# Patient Record
Sex: Male | Born: 1990 | Race: Black or African American | Hispanic: No | Marital: Single | Smoking: Never smoker
Health system: Southern US, Community
[De-identification: ages and names within clinical notes are randomized; demographics above are authoritative.]

## PROBLEM LIST (undated history)

## (undated) ENCOUNTER — Emergency Department (HOSPITAL_COMMUNITY)

## (undated) HISTORY — PX: RETINAL DETACHMENT SURGERY: SHX105

---

## 2013-05-29 ENCOUNTER — Ambulatory Visit (INDEPENDENT_AMBULATORY_CARE_PROVIDER_SITE_OTHER): Payer: 59 | Admitting: Internal Medicine

## 2013-05-29 VITALS — BP 113/64 | HR 72 | Temp 98.3°F | Resp 18 | Ht 70.5 in | Wt 147.0 lb

## 2013-05-29 DIAGNOSIS — Z0000 Encounter for general adult medical examination without abnormal findings: Secondary | ICD-10-CM

## 2013-05-29 DIAGNOSIS — Z719 Counseling, unspecified: Secondary | ICD-10-CM

## 2013-05-29 NOTE — Patient Instructions (Addendum)
Sickle Cell Disease °Sickle cell disease is also known as sickle cell anemia. This condition affects red blood cells (RBC's). Sickle cell disease is a genetically inherited disorder. RBC's are important in the body, because they contain hemoglobin. Hemoglobin carries oxygen to all of the tissues in the body. People who suffer from sickle cell disease have abnormally shaped hemoglobin molecules that look like sickles, and they cannot carry oxygen as well as normal hemoglobin molecules. The sickle cells also have a chemical on their surface that causes them to stick to vessel walls, so they have a more difficult time squeezing through small vessels. Sickle cells also have a shorter lifespan compared to normal RBC's. This means that the body's bone marrow, where RBC's are produced, must work harder to try and make RBC's faster than the die. However, for many individuals suffering from sickle cells disease, there bone marrow is not efficient enough. The resulting condition is known as anemia (a low number of RBC's). The severity of sickle cell disease depends on many factors, including, oxygen deprivation, concentration of hemoglobin, and the amount of a protective molecule called hemoglobin F (F for fetal). The people with greater amounts of hemoglobin F are better protected. °RISK FACTORS  °· Family members with sickle cell disease (both parents have to have the abnormal gene). °· Illness. °· Exertion. °· Dehydration. °· Family origins in areas with high incidence of malaria (Africa, [place], the Mediterranean, South and [place], the [place], and the Middle [place]). °· Physical stress. °· High altitude. °SYMPTOMS  °· Fever. °· Swelling of the hands and feet. °· Enlargement of the belly (heart, liver, and spleen). °· Frequent lung infections. °· Fatigue. °· Irritability. °· Yellowing of the skin (jaundice). °· Severe bone and joint pain. °· Delayed puberty. °· Shortness of breath. °· Pain in the belly, especially  in the upper right side of the abdomen. °· Nausea. °· Prolonged, sometimes painful erections (priapism). °· Rapid or labored breathing. °· Frequent infections. °PREVENTION  °· There are no proven methods for preventing sickle cell crises. °· Regularly follow up with your doctor. °· Avoid dehydration. °· Avoid high-altitude travel, especially rapid increases in altitude. °· Avoid stress. °· Get plenty of rest. °· Stay warm. °· Male patients may drink cranberry juice to help prevent urinary tract infections. °· Maintain good nutrition by eating green, red, and yellow vegetables; fruits; and juices that are rich in antioxidants and other important nutrients. °· Eat fish and soy products that are high in omega-three fatty acids. °· Make sure you consume enough folic acid, zinc, vitamin E, vitamin C, and L -glutamine. °· Blood transfusions. °· Immunizations, particularly against the flu and some bacteria, may reduce the risk of infection and thus sickle crisis. °TREATMENT °If sickle cell disease causes painful symptoms, then over-the-counter pain medications (ie. acetaminophen or ibuprofen) may be used, but consult your caregiver before use. Sickle cell disease is treated by managing pain and maintaining hydration. For patients with shortness of breath or difficulty breathing, oxygen may be given. Children who are at high-risk for the disease may be give blood transfusions. Other medications exist to help the body produce protective hemoglobin, it is important to discuss these with your caregiver. °Document Released: 12/08/2005 Document Revised: 03/01/2012 Document Reviewed: 03/22/2009 °ExitCare® Patient Information ©2014 [employer]. ° °

## 2013-05-29 NOTE — Progress Notes (Signed)
Subjective:    Patient ID: Steven Gross, male    DOB: 12/03/1991, 22 y.o.   MRN: [id number]  HPI Patient presents for a physical for school/ for a police conference in [place]. He is in school at [employer], he expects to graduate in 2015. 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 He denies any medical problems and states he is up to date with all vaccines. Patient is a non smoker, denies ETOH,  exercises about 3-4 times weekly for one hour each work out. States he has no medical concerns for the visit today. He does have some complaints of pain after bending over during the exam.   Tuberculosis Risk Questionnaire  1. Were you born outside the [place] in one of the following parts of the world: [place], [place], [place], [place] or [place]?  No  2. Have you traveled outside the [place] and lived for more than one month in one of the following parts of the world: [place], [place], [place], [place] or [place]?  No  3. Do you have a compromised immune system such as from any of the following conditions:HIV/AIDS, organ or bone marrow transplantation, diabetes, immunosuppressive medicines (e.g. Prednisone, Remicaide), leukemia, lymphoma, cancer of the head or neck, gastrectomy or jejunal bypass, end-stage renal disease (on dialysis), or silicosis?  No   4. Have you ever or do you plan on working in: a residential care center, a health care facility, a jail or prison or homeless shelter?  No  5. Have you ever: injected illegal drugs, used crack cocaine, lived in a homeless shelter  or been in jail or prison?   No  6. Have you ever been exposed to anyone with infectious tuberculosis?  No   Tuberculosis Symptom Questionnaire  Do you currently have any of the following symptoms?  1. Unexplained cough lasting more than 3 weeks? No  2. Unexplained fever lasting more than 3 weeks. No   3. Night Sweats (sweating that leaves the bedclothes  and sheets wet)   No  4. Shortness of Breath No  5. Chest Pain No  6. Unintentional weight loss  No  7. Unexplained fatigue (very tired for no reason) No    Review of Systems  Constitutional: Negative.   Eyes: Negative.   Respiratory: Negative.   Cardiovascular: Negative.   Gastrointestinal: Negative.   Endocrine: Negative.   Genitourinary: Negative.   Musculoskeletal: Negative.   Skin: Negative.   Allergic/Immunologic: Negative.   Neurological: Negative.   Hematological: Negative.   Psychiatric/Behavioral: Negative.        Objective:   Physical Exam  Constitutional: He is oriented to person, place, and time. He appears well-developed and well-nourished. No distress.  HENT:  Head: Normocephalic.  Right Ear: External ear normal.  Left Ear: External ear normal.  Nose: Nose normal.  Mouth/Throat: Oropharynx is clear and moist.  Eyes: Conjunctivae and EOM are normal. Pupils are equal, round, and reactive to light.  Neck: Normal range of motion. Neck supple. No tracheal deviation present. No thyromegaly present.  Cardiovascular: Normal rate, regular rhythm and normal heart sounds.   No murmur heard. Pulmonary/Chest: Effort normal and breath sounds normal.  Abdominal: Soft. Bowel sounds are normal. There is no tenderness. Hernia confirmed negative in the right inguinal area and confirmed negative in the left inguinal area.  Genitourinary: Testes normal and penis normal.  Musculoskeletal: Normal range of motion. He exhibits no edema and no tenderness.  Lymphadenopathy:  He has no cervical adenopathy.       Right: No inguinal adenopathy present.       Left: No inguinal adenopathy present.  Neurological: He is alert and oriented to person, place, and time. He has normal reflexes. No cranial nerve deficit. He exhibits normal muscle tone. Coordination normal.  Skin: Skin is warm and dry. No rash noted.  Psychiatric: He has a normal mood and affect. His behavior is normal.  Judgment and thought content normal.   Well developed healthy appearing 22 y.o. Male in no distress. He does have some complaints of dizziness after bending over.      Assessment & Plan:  Healthy exam

## 2014-04-06 ENCOUNTER — Ambulatory Visit: Payer: Self-pay | Admitting: Family Medicine

## 2014-04-06 VITALS — BP 120/80 | HR 77 | Temp 98.3°F | Resp 16 | Ht 69.0 in | Wt 149.0 lb

## 2014-04-06 DIAGNOSIS — Z0289 Encounter for other administrative examinations: Secondary | ICD-10-CM

## 2014-04-06 DIAGNOSIS — Z0000 Encounter for general adult medical examination without abnormal findings: Secondary | ICD-10-CM

## 2014-04-06 NOTE — Progress Notes (Signed)
[facility] [address], [place] [id number] [phone]  Date:  04/06/2014   Name:  Steven Gross   DOB:  1991/05/25   MRN:  [id number]  PCP:  France RavensBRETT,CHARLES B, MD    Chief Complaint: Employment Physical   History of Present Illness:  Steven OysterReed A Eastland is a 23 y.o. 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  Denies any significant PMHx or any complaints today.  Does not want labs or immunizations today, just his form filled out.  Reviewed forms- he has no significant history of illness or injury per his report   There are no active problems to display for this patient.   History reviewed. No pertinent past medical history.  History reviewed. No pertinent past surgical history.  History  Substance Use Topics  . Smoking status: Never Smoker   . Smokeless tobacco: Not on file  . Alcohol Use: No    History reviewed. No pertinent family history.  No Known Allergies  Medication list has been reviewed and updated.  No current outpatient prescriptions on file prior to visit.   No current facility-administered medications on file prior to visit.    Review of Systems:  As per HPI- otherwise negative.   Physical Examination: Filed Vitals:   04/06/14 1037  BP: 120/80  Pulse: 77  Temp: 98.3 F (36.8 C)  Resp: 16   Filed Vitals:   04/06/14 1037  Height: 5\' 9"  (1.753 m)  Weight: 149 lb (67.586 kg)   Body mass index is 21.99 kg/(m^2). Ideal Body Weight: Weight in (lb) to have BMI = 25: 168.9  GEN: WDWN, NAD, Non-toxic, A & O x 3, looks well HEENT: Atraumatic, Normocephalic. Neck supple. No masses, No LAD.  Bilateral TM wnl, oropharynx normal.  PEERL,EOMI.   Ears and Nose: No external deformity. CV: RRR, No M/G/R. No JVD. No thrill. No extra heart sounds. PULM: CTA B, no wheezes, crackles, rhonchi. No retractions. No resp. distress. No accessory muscle  use. ABD: S, NT, ND, +BS. No rebound. No HSM. EXTR: No c/c/e NEURO Normal gait.  PSYCH: Normally interactive. Conversant. Not depressed or anxious appearing.  Calm demeanor.  GU: no inguinal hernia Normal strength, sensation and DTR of all extremities     Assessment and Plan: Physical exam  administrative PE for police department.  My only comment is that he needs to update his corrective lenses rx.  He will plan to do so.  He thinks he has an up to date PPD from work, but if he needs one we can do for him  Signed Abbe AmsterdamJessica Copland, MD

## 2014-04-06 NOTE — Progress Notes (Signed)
U/A SpGr- 1.010 Protein- Trace Blood- Neg Glucose- Neg Tuberculosis Risk Questionnaire  1. No Were you born outside the [place] in one of the following parts of the world: [place], [place], [place], [place] or [place]?    2. No Have you traveled outside the [place] and lived for more than one month in one of the following parts of the world: [place], [place], [place], [place] or [place]?    3. No Do you have a compromised immune system such as from any of the following conditions:HIV/AIDS, organ or bone marrow transplantation, diabetes, immunosuppressive medicines (e.g. Prednisone, Remicaide), leukemia, lymphoma, cancer of the head or neck, gastrectomy or jejunal bypass, end-stage renal disease (on dialysis), or silicosis?     4. Yes Have you ever or do you plan on working in: a residential care center, a health care facility, a jail or prison or homeless shelter?    5. No Have you ever: injected illegal drugs, used crack cocaine, lived in a homeless shelter  or been in jail or prison?     6. No Have you ever been exposed to anyone with infectious tuberculosis?    Tuberculosis Symptom Questionnaire  Do you currently have any of the following symptoms?  1. No Unexplained cough lasting more than 3 weeks?   2. No Unexplained fever lasting more than 3 weeks.   3. No Night Sweats (sweating that leaves the bedclothes and sheets wet)     4. No Shortness of Breath   5. No Chest Pain   6. No Unintentional weight loss    7. No Unexplained fatigue (very tired for no reason)

## 2014-04-24 ENCOUNTER — Encounter: Payer: 59 | Admitting: Family Medicine

## 2014-06-09 ENCOUNTER — Ambulatory Visit (INDEPENDENT_AMBULATORY_CARE_PROVIDER_SITE_OTHER): Payer: 59

## 2014-06-09 ENCOUNTER — Ambulatory Visit (INDEPENDENT_AMBULATORY_CARE_PROVIDER_SITE_OTHER): Payer: 59 | Admitting: Emergency Medicine

## 2014-06-09 VITALS — BP 120/78 | HR 77 | Temp 98.0°F | Resp 20 | Ht 71.0 in | Wt 153.0 lb

## 2014-06-09 DIAGNOSIS — R1030 Lower abdominal pain, unspecified: Secondary | ICD-10-CM

## 2014-06-09 DIAGNOSIS — R109 Unspecified abdominal pain: Secondary | ICD-10-CM

## 2014-06-09 NOTE — Progress Notes (Signed)
   Subjective:    Patient ID: Steven Gross, male    DOB: 05/20/1991, 22 y.o.   MRN: [id number]  HPI 23 year old male pt presents with a groin injury. 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 At the end of the course he felt pain in his groin. He never felt a "pop."    Review of Systems     Objective:   Physical Exam abdomen soft nontender. Deep tendon reflexes of the lower extremities are 2+ and symmetrical. Motor strength is 5 out 5 almost groups of the lower extremities. Genital exam is normal without hernia. Testicles are normal  [facility] reading (PRIMARY) by  Dr.Daub normal       Assessment & Plan:  Patient given a note for one week to abstain from physical assessment and physical fitness training.

## 2015-02-24 ENCOUNTER — Ambulatory Visit (INDEPENDENT_AMBULATORY_CARE_PROVIDER_SITE_OTHER): Payer: 59 | Admitting: Internal Medicine

## 2015-02-24 VITALS — BP 124/68 | HR 70 | Temp 98.0°F | Resp 18 | Ht 69.5 in | Wt 151.0 lb

## 2015-02-24 DIAGNOSIS — Z0000 Encounter for general adult medical examination without abnormal findings: Secondary | ICD-10-CM

## 2015-02-24 NOTE — Progress Notes (Signed)
   Subjective:    Patient ID: Steven Gross, male    DOB: [birth year], 24 y.o.   MRN: [id number] This chart was scribed for Tonye Pearsonobert P Juanell Saffo, MD by [place] Peace, ED Scribe. The patient was seen in [facility]. The patient's care was started at 8:48 AM.  HPI  HPI Comments: Steven Gross is a 24 y.o. male [facility]. He states that he has already passed [facility] physical activity [facility] but explains that he has another physical activity test coming up next week for the [employer].    Pt denies any history of surgery, hypertension, or asthma.  Pt is non-smoker.  Immunizations up to date.  Daily exercise/good diet  There are no active problems to display for this patient.  no medications  Review of Systems  All other systems reviewed and are negative. A complete 14 system review of systems was obtained per form and all systems are negative except as noted in the HPI and PMH.      Objective:   Physical Exam  Constitutional: He is oriented to person, place, and time. He appears well-developed and well-nourished.  HENT:  Head: Normocephalic and atraumatic.  Right Ear: Hearing, tympanic membrane, external ear and ear canal normal.  Left Ear: Hearing, tympanic membrane, external ear and ear canal normal.  Nose: Nose normal.  Mouth/Throat: Uvula is midline, oropharynx is clear and moist and mucous membranes are normal.  Eyes: Conjunctivae, EOM and lids are normal. Pupils are equal, round, and reactive to light. Right eye exhibits no discharge. Left eye exhibits no discharge. No scleral icterus.  Neck: Trachea normal and normal range of motion. Neck supple. Carotid bruit is not present.  Cardiovascular: Normal rate, regular rhythm, normal heart sounds, intact distal pulses and normal pulses.   No murmur heard. Pulmonary/Chest: Effort normal and breath sounds normal.  No respiratory distress. He has no wheezes. He has no rhonchi. He has no rales.  Abdominal: Soft. Normal appearance and bowel sounds are normal. He exhibits no abdominal bruit. There is no tenderness.  Musculoskeletal: Normal range of motion. He exhibits no edema or tenderness.  Lymphadenopathy:       Head (right side): No submental, no submandibular, no tonsillar, no preauricular, no posterior auricular and no occipital adenopathy present.       Head (left side): No submental, no submandibular, no tonsillar, no preauricular, no posterior auricular and no occipital adenopathy present.    He has no cervical adenopathy.  Neurological: He is alert and oriented to person, place, and time. He has normal strength and normal reflexes. No cranial nerve deficit or sensory deficit. Coordination and gait normal.  Skin: Skin is warm, dry and intact. No lesion and no rash noted.  Psychiatric: He has a normal mood and affect. His speech is normal and behavior is normal. Judgment and thought content normal.  BP 124/68 mmHg  Pulse 70  Temp(Src) 98 F (36.7 C) (Oral)  Resp 18  Ht 5' 9.5" (1.765 m)  Wt 151 lb (68.493 kg)  BMI 21.99 kg/m2  SpO2 98%         Assessment & Plan:  I have completed the patient encounter in its entirety as documented by the scribe, with editing by me where necessary. Haniah Penny P. Merla Richesoolittle, M.D.  Routine general medical examination at a health care facility  Very healthy Discussed tightness in hamstrings

## 2015-03-02 ENCOUNTER — Telehealth: Payer: Self-pay

## 2015-03-02 NOTE — Telephone Encounter (Signed)
Patient would like for us to call his work Engineer, civil (consulting)nurse and let you know that the form we filled out and faxed to his employer was suppose to have his name on it.  We faxed this form and left his information off the form.  Please call patient if you have any questions at [phone].    Nurse Work Phone#: [phone]

## 2015-03-02 NOTE — Telephone Encounter (Signed)
Left message for pt to call back  °

## 2015-03-02 NOTE — Telephone Encounter (Signed)
Spoke with pt, he states everything was taken care of.

## 2017-04-20 ENCOUNTER — Encounter (HOSPITAL_BASED_OUTPATIENT_CLINIC_OR_DEPARTMENT_OTHER): Payer: Self-pay

## 2017-04-20 ENCOUNTER — Emergency Department (HOSPITAL_BASED_OUTPATIENT_CLINIC_OR_DEPARTMENT_OTHER): Payer: Commercial Managed Care - HMO

## 2017-04-20 ENCOUNTER — Emergency Department (HOSPITAL_BASED_OUTPATIENT_CLINIC_OR_DEPARTMENT_OTHER)
Admission: EM | Admit: 2017-04-20 | Discharge: 2017-04-21 | Disposition: A | Discharge status: Home / Self Care | Payer: Commercial Managed Care - HMO | Attending: Emergency Medicine | Admitting: Emergency Medicine

## 2017-04-20 DIAGNOSIS — R109 Unspecified abdominal pain: Secondary | ICD-10-CM | POA: Insufficient documentation

## 2017-04-20 DIAGNOSIS — R110 Nausea: Secondary | ICD-10-CM | POA: Insufficient documentation

## 2017-04-20 LAB — COMPREHENSIVE METABOLIC PANEL
ALK PHOS: 54 U/L (ref 38–126)
ALT: 16 U/L — AB (ref 17–63)
AST: 21 U/L (ref 15–41)
Albumin: 4.6 g/dL (ref 3.5–5.0)
Anion gap: 9 (ref 5–15)
BILIRUBIN TOTAL: 1.4 mg/dL — AB (ref 0.3–1.2)
BUN: 17 mg/dL (ref 6–20)
CALCIUM: 9.4 mg/dL (ref 8.9–10.3)
CO2: 27 mmol/L (ref 22–32)
Chloride: 102 mmol/L (ref 101–111)
Creatinine, Ser: 1.01 mg/dL (ref 0.61–1.24)
Glucose, Bld: 109 mg/dL — ABNORMAL HIGH (ref 65–99)
Potassium: 3.5 mmol/L (ref 3.5–5.1)
Sodium: 138 mmol/L (ref 135–145)
TOTAL PROTEIN: 8.5 g/dL — AB (ref 6.5–8.1)

## 2017-04-20 LAB — CBC WITH DIFFERENTIAL/PLATELET
BASOS ABS: 0 10*3/uL (ref 0.0–0.1)
Basophils Relative: 0 %
EOS PCT: 0 %
Eosinophils Absolute: 0 10*3/uL (ref 0.0–0.7)
HCT: 47.2 % (ref 39.0–52.0)
Hemoglobin: 17.4 g/dL — ABNORMAL HIGH (ref 13.0–17.0)
LYMPHS ABS: 0.5 10*3/uL — AB (ref 0.7–4.0)
LYMPHS PCT: 8 %
MCH: 31.7 pg (ref 26.0–34.0)
MCHC: 36.9 g/dL — ABNORMAL HIGH (ref 30.0–36.0)
MCV: 86 fL (ref 78.0–100.0)
Monocytes Absolute: 0.5 10*3/uL (ref 0.1–1.0)
Monocytes Relative: 9 %
Neutro Abs: 5 10*3/uL (ref 1.7–7.7)
Neutrophils Relative %: 83 %
PLATELETS: 309 10*3/uL (ref 150–400)
RBC: 5.49 MIL/uL (ref 4.22–5.81)
RDW: 11.4 % — AB (ref 11.5–15.5)
WBC: 6 10*3/uL (ref 4.0–10.5)

## 2017-04-20 LAB — URINALYSIS, ROUTINE W REFLEX MICROSCOPIC
Bilirubin Urine: NEGATIVE
Glucose, UA: NEGATIVE mg/dL
Hgb urine dipstick: NEGATIVE
KETONES UR: 15 mg/dL — AB
Leukocytes, UA: NEGATIVE
NITRITE: NEGATIVE
PROTEIN: NEGATIVE mg/dL
Specific Gravity, Urine: 1.037 — ABNORMAL HIGH (ref 1.005–1.030)
pH: 6.5 (ref 5.0–8.0)

## 2017-04-20 MED ORDER — FENTANYL CITRATE (PF) 100 MCG/2ML IJ SOLN
50.0000 ug | Freq: Once | INTRAMUSCULAR | Status: AC
  Administered 2017-04-20: 50 ug via INTRAVENOUS
  Filled 2017-04-20: qty 2

## 2017-04-20 MED ORDER — ONDANSETRON HCL 4 MG/2ML IJ SOLN
4.0000 mg | Freq: Once | INTRAMUSCULAR | Status: AC
Start: 2017-04-21 — End: 2017-04-20
  Administered 2017-04-20: 4 mg via INTRAVENOUS
  Filled 2017-04-20: qty 2

## 2017-04-20 NOTE — ED Triage Notes (Signed)
c/o abd pain x today-seen at urgent care-was advised to go to ED if pain worsened-pain worse since 4pm-NAD-steady gait

## 2017-04-20 NOTE — ED Provider Notes (Signed)
[facility]   CSN: [id number] Arrival date & time: 04/20/17  2235   By signing my name below, I, Clovis Pu, attest that this documentation has been prepared under the direction and in the presence of Angeleen Horney, MD  Electronically Signed: Clovis Pu, ED Scribe. 04/20/17. 11:25 PM.   History   Chief Complaint Chief Complaint  Patient presents with  . Abdominal Pain    HPI Comments:  Steven Gross is a 26 y.o. male who presents to the Emergency Department complaining of acute onset, moderate, non-radiating abdominal pain beginning around 4 PM today. Pt states his pain began while he was siting at work. Nothing makes his pain better or worse. Pt also reports nausea. His last bowel movement was at 3 PM today. No alleviating or aggravating factors noted. Pt denies vomiting, diarrhea, constipation, fevers, dysuria, frequency, hematuria, hematochezia or any other associated symptoms. Pt visited Urgent Care earlier today for similar symptoms and was advised to visit the ED for further testing. No other complaints noted at this time.   The history is provided by the patient. No language interpreter was used.  Abdominal Pain   This is a new problem. The current episode started 12 to 24 hours ago. The problem occurs constantly. The problem has not changed since onset.The pain is associated with an unknown factor. Pain location: below the umbilicus. Quality: unable to qualify. The pain is moderate. Associated symptoms include nausea. Pertinent negatives include anorexia, fever, diarrhea, flatus, vomiting, constipation, dysuria, frequency, hematuria and myalgias. Nothing aggravates the symptoms. Nothing relieves the symptoms. Past workup does not include GI consult. His past medical history does not include PUD.    History reviewed. No pertinent past medical history.  There are no active problems to display for this patient.   History reviewed. No pertinent  surgical history.   Home Medications    Prior to Admission medications   Not on File    Family History No family history on file.  Social History Social History  Substance Use Topics  . Smoking status: Never Smoker  . Smokeless tobacco: Never Used  . Alcohol use No     Allergies   Patient has no known allergies.   Review of Systems Review of Systems  Constitutional: Negative for fever.  Gastrointestinal: Positive for abdominal pain and nausea. Negative for anorexia, blood in stool, constipation, diarrhea, flatus and vomiting.  Genitourinary: Negative for dysuria, frequency and hematuria.  Musculoskeletal: Negative for myalgias.  All other systems reviewed and are negative.   Physical Exam Updated Vital Signs BP 133/74 (BP Location: Left Arm)   Pulse (!) 108   Temp 98.7 F (37.1 C) (Oral)   Resp 18   Ht  (1.778 m)   Wt 148 lb (67.1 kg)   SpO2 100%   BMI 21.24 kg/m   Physical Exam  Constitutional: He is oriented to person, place, and time. He appears well-developed and well-nourished. No distress.  HENT:  Head: Normocephalic and atraumatic.  Mouth/Throat: Oropharynx is clear and moist. No oropharyngeal exudate.  Moist mucous membranes   Eyes: Conjunctivae are normal. Pupils are equal, round, and reactive to light.  Neck: Normal range of motion. Neck supple. No JVD present.  Trachea midline No bruit  Cardiovascular: Normal rate, regular rhythm, normal heart sounds and intact distal pulses.   Pulses:      Posterior tibial pulses are 2+ on the right side, and 2+ on the left side.  Pulmonary/Chest: Effort normal and  breath sounds normal. No stridor. No respiratory distress. He has no wheezes. He has no rales.  Abdominal: Soft. Bowel sounds are normal. He exhibits no distension and no mass. There is no tenderness. There is no rebound and no guarding.  Musculoskeletal: Normal range of motion.  Compartments soft   Neurological: He is alert and oriented to  person, place, and time. He has normal reflexes.  Intact DTRs.   Skin: Skin is warm and dry. Capillary refill takes less than 2 seconds.  Psychiatric: He has a normal mood and affect. His behavior is normal.  Nursing note and vitals reviewed.    ED Treatments / Results  DIAGNOSTIC STUDIES:  Oxygen Saturation is 100% on RA, normal by my interpretation.    COORDINATION OF CARE:  11:22 PM Discussed treatment plan with pt at bedside and pt agreed to plan.  Labs (all labs ordered are listed, but only abnormal results are displayed)  Results for orders placed or performed during the hospital encounter of 04/20/17  Comprehensive metabolic panel  Result Value Ref Range   Sodium 138 135 - 145 mmol/L   Potassium 3.5 3.5 - 5.1 mmol/L   Chloride 102 101 - 111 mmol/L   CO2 27 22 - 32 mmol/L   Glucose, Bld 109 (H) 65 - 99 mg/dL   BUN 17 6 - 20 mg/dL   Creatinine, Ser 1.61 0.61 - 1.24 mg/dL   Calcium 9.4 8.9 - 09.6 mg/dL   Total Protein 8.5 (H) 6.5 - 8.1 g/dL   Albumin 4.6 3.5 - 5.0 g/dL   AST 21 15 - 41 U/L   ALT 16 (L) 17 - 63 U/L   Alkaline Phosphatase 54 38 - 126 U/L   Total Bilirubin 1.4 (H) 0.3 - 1.2 mg/dL   GFR calc non Af Amer >60 >60 mL/min   GFR calc Af Amer >60 >60 mL/min   Anion gap 9 5 - 15  Urinalysis, Routine w reflex microscopic  Result Value Ref Range   Color, Urine YELLOW YELLOW   APPearance CLEAR CLEAR   Specific Gravity, Urine 1.037 (H) 1.005 - 1.030   pH 6.5 5.0 - 8.0   Glucose, UA NEGATIVE NEGATIVE mg/dL   Hgb urine dipstick NEGATIVE NEGATIVE   Bilirubin Urine NEGATIVE NEGATIVE   Ketones, ur 15 (A) NEGATIVE mg/dL   Protein, ur NEGATIVE NEGATIVE mg/dL   Nitrite NEGATIVE NEGATIVE   Leukocytes, UA NEGATIVE NEGATIVE  CBC with Differential  Result Value Ref Range   WBC 6.0 4.0 - 10.5 K/uL   RBC 5.49 4.22 - 5.81 MIL/uL   Hemoglobin 17.4 (H) 13.0 - 17.0 g/dL   HCT 04.5 40.9 - 81.1 %   MCV 86.0 78.0 - 100.0 fL   MCH 31.7 26.0 - 34.0 pg   MCHC 36.9 (H) 30.0  - 36.0 g/dL   RDW 91.4 (L) 78.2 - 95.6 %   Platelets 309 150 - 400 K/uL   Neutrophils Relative % 83 %   Neutro Abs 5.0 1.7 - 7.7 K/uL   Lymphocytes Relative 8 %   Lymphs Abs 0.5 (L) 0.7 - 4.0 K/uL   Monocytes Relative 9 %   Monocytes Absolute 0.5 0.1 - 1.0 K/uL   Eosinophils Relative 0 %   Eosinophils Absolute 0.0 0.0 - 0.7 K/uL   Basophils Relative 0 %   Basophils Absolute 0.0 0.0 - 0.1 K/uL   Ct Abdomen Pelvis W Contrast  Result Date: 04/21/2017 CLINICAL DATA:  Periumbilical pain for 10 hours EXAM: CT ABDOMEN AND  PELVIS WITH CONTRAST TECHNIQUE: Multidetector CT imaging of the abdomen and pelvis was performed using the standard protocol following bolus administration of intravenous contrast. CONTRAST:  ISOVUE-300 IOPAMIDOL (ISOVUE-300) INJECTION 61% COMPARISON:  None. FINDINGS: Lower chest: No acute abnormality. Hepatobiliary: No focal liver abnormality is seen. No gallstones, gallbladder wall thickening, or biliary dilatation. Pancreas: Unremarkable. No pancreatic ductal dilatation or surrounding inflammatory changes. Spleen: Normal in size without focal abnormality. Adrenals/Urinary Tract: Adrenal glands are unremarkable. Kidneys are normal, without renal calculi, focal lesion, or hydronephrosis. Bladder is unremarkable. Stomach/Bowel: Stomach is within normal limits. Appendix appears normal. No evidence of bowel wall thickening, distention, or inflammatory changes. Vascular/Lymphatic: No significant vascular findings are present. No enlarged abdominal or pelvic lymph nodes. Reproductive: Prostate is unremarkable. Other: No abdominal wall hernia or abnormality. No abdominopelvic ascites. Musculoskeletal: No acute or significant osseous findings. IMPRESSION: No CT evidence for acute intra-abdominal or pelvic pathology. Normal appendix. Electronically Signed   By: Jasmine Pang M.D.   On: 04/21/2017 03:00    Procedures Procedures (including critical care time)  Medications Ordered in  ED  Medications  fentaNYL (SUBLIMAZE) injection 50 mcg (50 mcg Intravenous Given 04/20/17 2355)  ondansetron (ZOFRAN) injection 4 mg (4 mg Intravenous Given 04/20/17 2353)  iopamidol (ISOVUE-300) 61 % injection 100 mL (100 mLs Intravenous Contrast Given 04/21/17 0148)     Final Clinical Impressions(s) / ED Diagnoses  Abdominal pain:  Likely cramping, may be viral in nature.  Exam, labs and imaging are benign and reassuring.  Follow up with your pediatrician for recheck in 48 hours.  Strict return precautions given.  Bland diet x 48 hours.    I personally performed the services described in this documentation, which was scribed in my presence. The recorded information has been reviewed and is accurate.      Cy Blamer, MD 04/21/17 [phone]

## 2017-04-21 ENCOUNTER — Encounter (HOSPITAL_BASED_OUTPATIENT_CLINIC_OR_DEPARTMENT_OTHER): Payer: Self-pay | Admitting: Emergency Medicine

## 2017-04-21 MED ORDER — IOPAMIDOL (ISOVUE-300) INJECTION 61%
100.0000 mL | Freq: Once | INTRAVENOUS | Status: AC | PRN
  Administered 2017-04-21: 100 mL via INTRAVENOUS

## 2017-04-21 NOTE — ED Notes (Signed)
Patient transported to CT 

## 2017-09-10 IMAGING — CT CT ABD-PELV W/ CM
2 of 4 series · 16 of 46 positions shown, 18 images · IV contrast (APPLIED)
Comparison: None.

CLINICAL DATA: Periumbilical pain for 10 hours

EXAM:
CT ABDOMEN AND PELVIS WITH CONTRAST
TECHNIQUE: Multidetector CT imaging of the abdomen and pelvis was performed
using the standard protocol following bolus administration of
intravenous contrast.
CONTRAST:  100mL ZLLTQD-R88 IOPAMIDOL (ZLLTQD-R88) INJECTION 61%

[Series 2: axial st · axial · 0.79mm/px · z∈[-446,+10]mm · 13 of 101 slices shown, 15 images]
[im 5/101  soft-tissue]
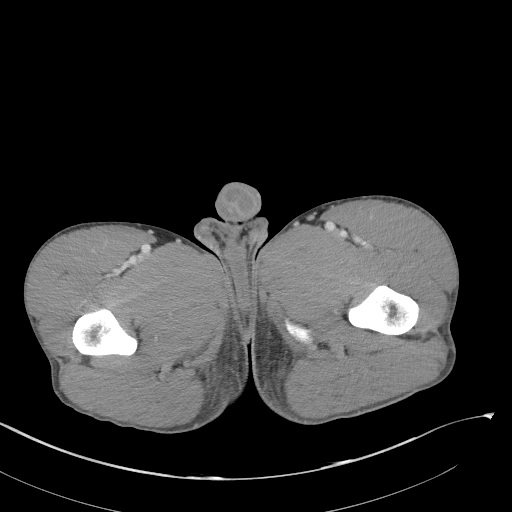
[im 5/101  bone]
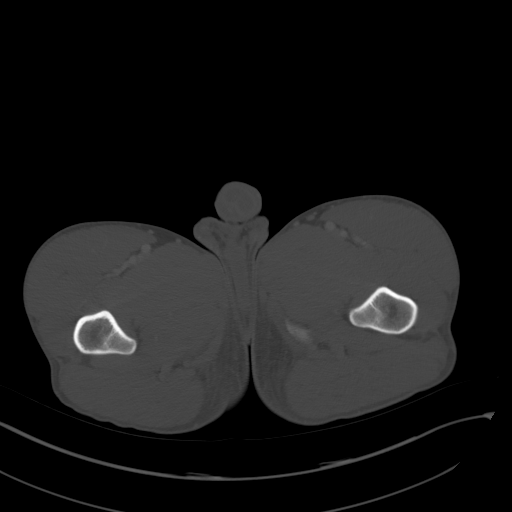
[im 13/101  soft-tissue]
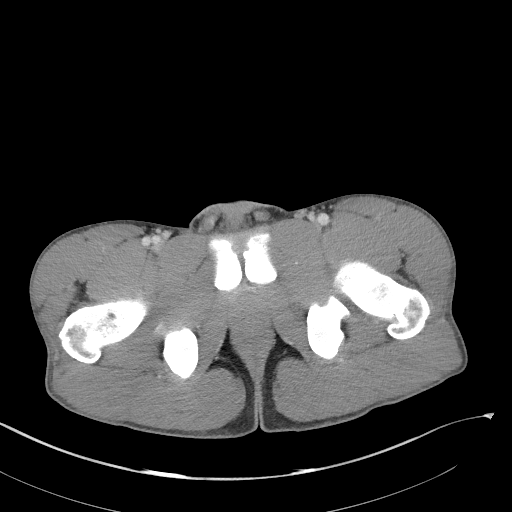
[im 21/101  soft-tissue]
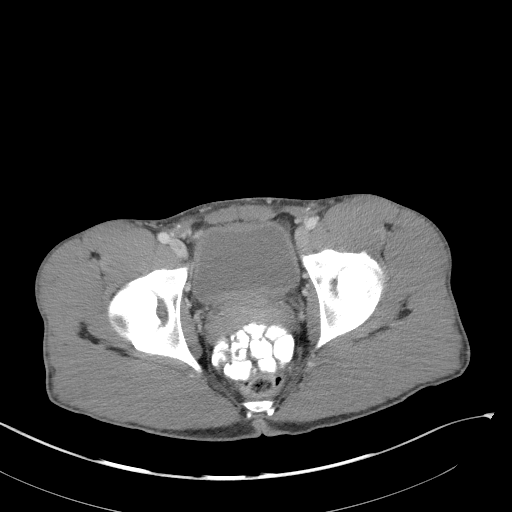
[im 30/101  soft-tissue]
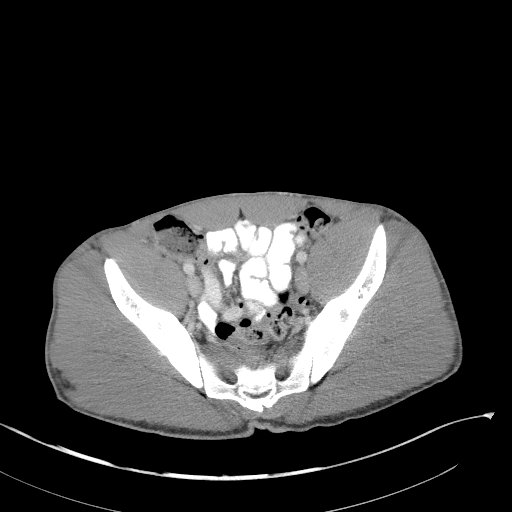
[im 34/101  soft-tissue]
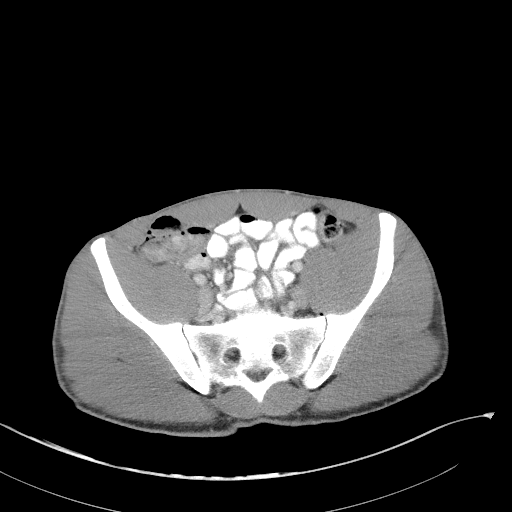
[im 42/101  soft-tissue]
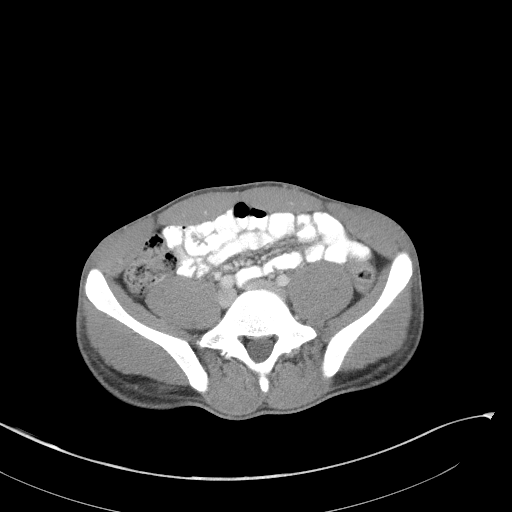
[im 51/101  soft-tissue]
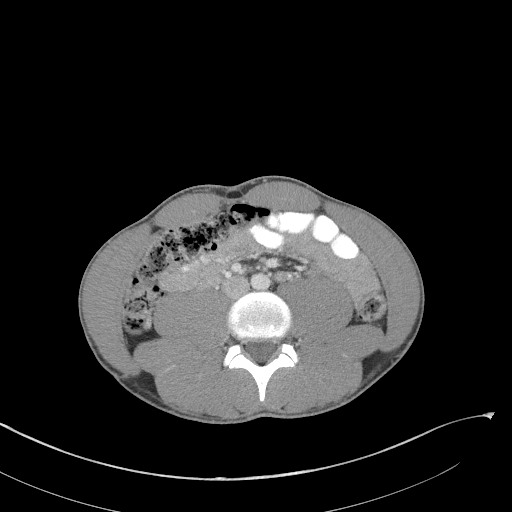
[im 59/101  soft-tissue]
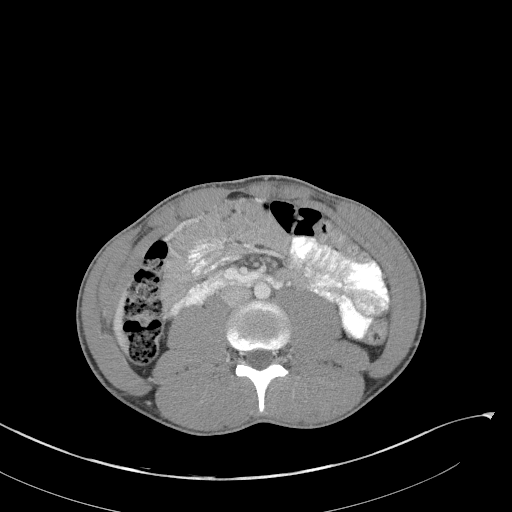
[im 67/101  soft-tissue]
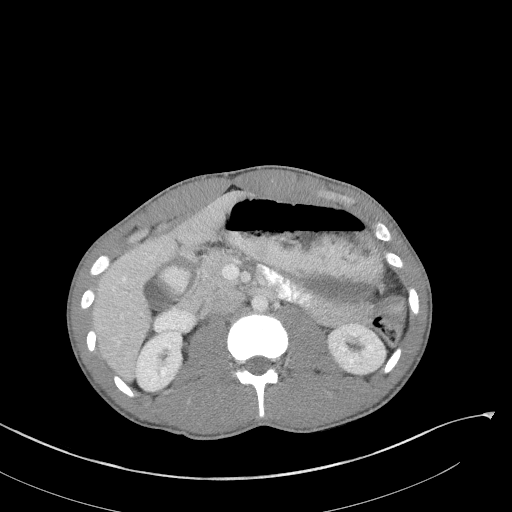
[im 67/101  bone]
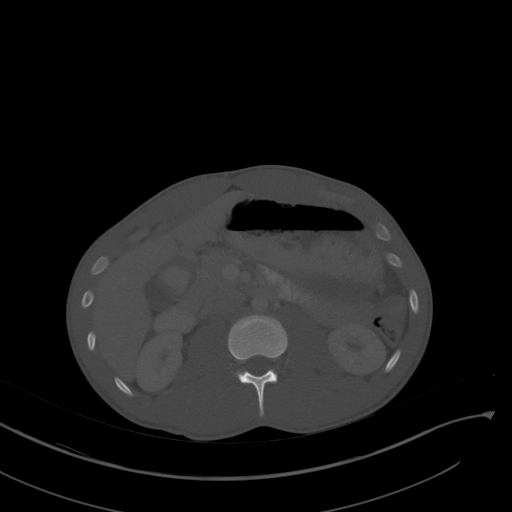
[im 71/101  soft-tissue]
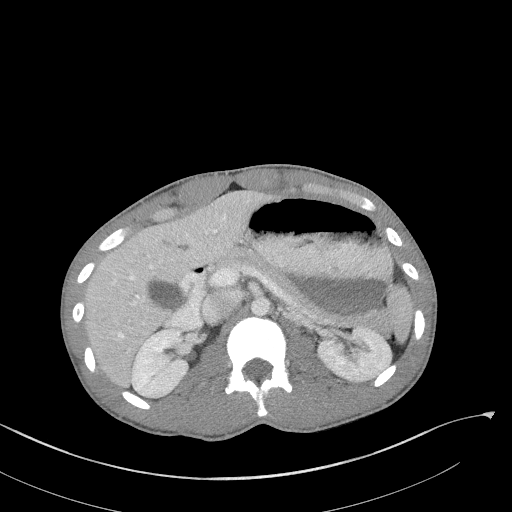
[im 80/101  soft-tissue]
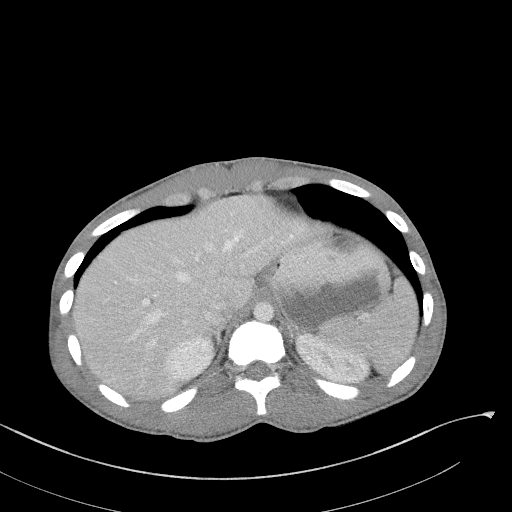
[im 88/101  soft-tissue]
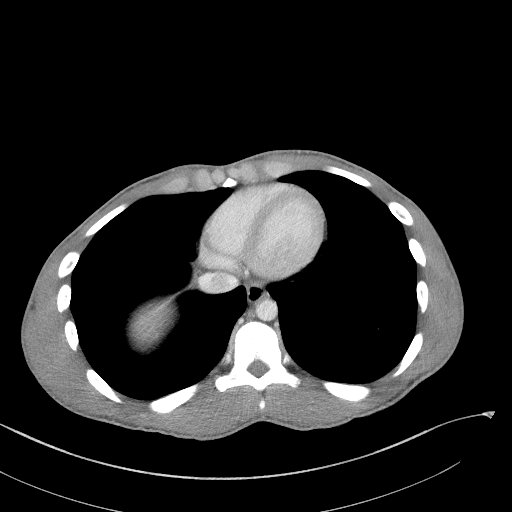
[im 96/101  soft-tissue]
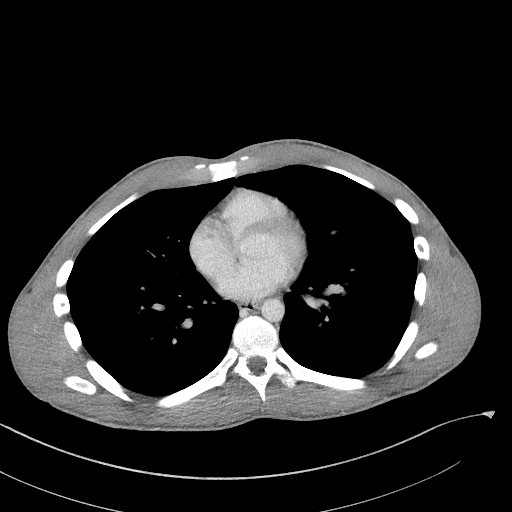

[Series 5: coronal st · coronal · 0.71mm/px · 3 of 72 slices shown]
[im 24/72  soft-tissue]
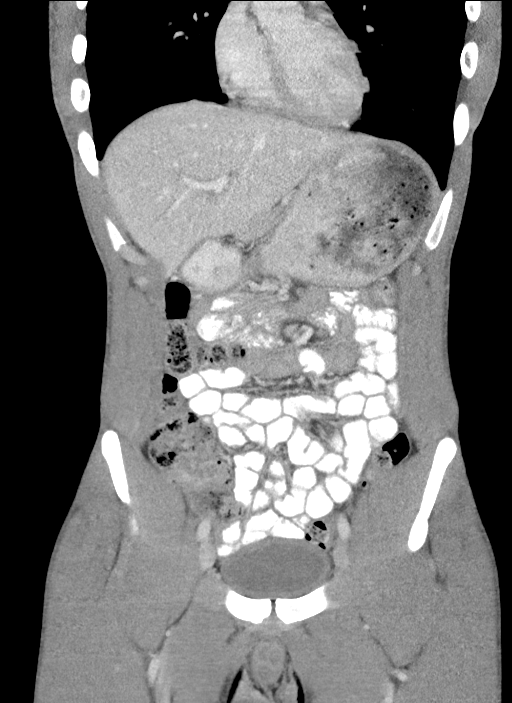
[im 32/72  soft-tissue]
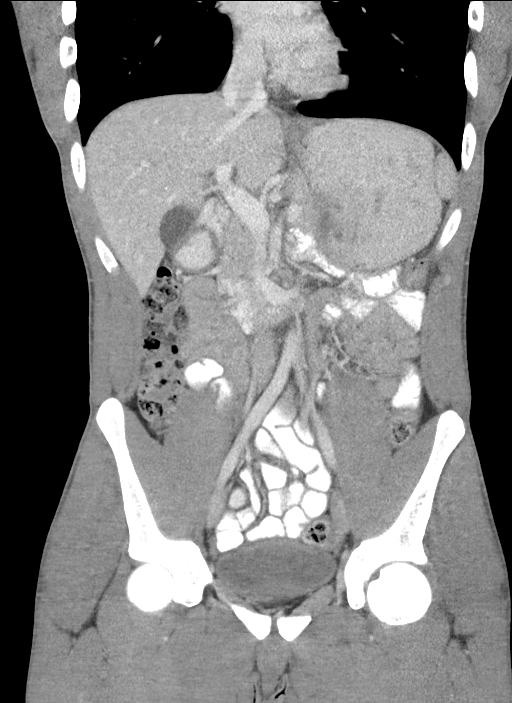
[im 40/72  soft-tissue]
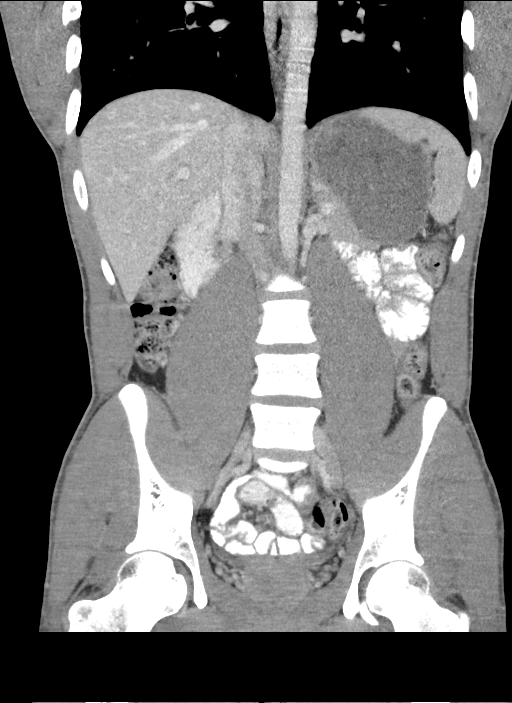

[16 of 46 positions shown; findings below may reference images not displayed]

FINDINGS: Lower chest: No acute abnormality.

Hepatobiliary: No focal liver abnormality is seen. No gallstones,
gallbladder wall thickening, or biliary dilatation.

Pancreas: Unremarkable. No pancreatic ductal dilatation or
surrounding inflammatory changes.

Spleen: Normal in size without focal abnormality.

Adrenals/Urinary Tract: Adrenal glands are unremarkable. Kidneys are
normal, without renal calculi, focal lesion, or hydronephrosis.
Bladder is unremarkable.

Stomach/Bowel: Stomach is within normal limits. Appendix appears
normal. No evidence of bowel wall thickening, distention, or
inflammatory changes.

Vascular/Lymphatic: No significant vascular findings are present. No
enlarged abdominal or pelvic lymph nodes.

Reproductive: Prostate is unremarkable.

Other: No abdominal wall hernia or abnormality. No abdominopelvic
ascites.

Musculoskeletal: No acute or significant osseous findings.
IMPRESSION: No CT evidence for acute intra-abdominal or pelvic pathology. Normal
appendix.

## 2019-02-07 ENCOUNTER — Encounter (HOSPITAL_COMMUNITY): Payer: Self-pay

## 2019-02-07 ENCOUNTER — Ambulatory Visit (HOSPITAL_COMMUNITY)
Admission: EM | Admit: 2019-02-07 | Discharge: 2019-02-07 | Disposition: A | Discharge status: Home / Self Care | Payer: Managed Care, Other (non HMO) | Attending: Family Medicine | Admitting: Family Medicine

## 2019-02-07 DIAGNOSIS — Z113 Encounter for screening for infections with a predominantly sexual mode of transmission: Secondary | ICD-10-CM | POA: Insufficient documentation

## 2019-02-07 NOTE — Discharge Instructions (Addendum)
We are testing you for STDs Your labs are pending.  We will call with any positive results.  You can check your results on MyChart for updates.

## 2019-02-07 NOTE — ED Triage Notes (Signed)
Pt presents to have HIV testing done.

## 2019-02-07 NOTE — ED Provider Notes (Signed)
[facility]    CSN: [id number] Arrival date & time: 02/07/19  0254     History   Chief Complaint Chief Complaint  Patient presents with  . STD Testing    HPI Steven Gross is a 28 y.o. male.   Patient is a 28 year old male here requesting STD testing.  He denies any current symptoms.  He is otherwise feeling fine.  He is currently sexually active with one partner, protected.  This is routine testing.     History reviewed. No pertinent past medical history.  There are no active problems to display for this patient.   History reviewed. No pertinent surgical history.     Home Medications    Prior to Admission medications   Not on File    Family History Family History  Problem Relation Age of Onset  . Healthy Mother   . Healthy Father     Social History Social History   Tobacco Use  . Smoking status: Never Smoker  . Smokeless tobacco: Never Used  Substance Use Topics  . Alcohol use: No  . Drug use: No     Allergies   Patient has no known allergies.   Review of Systems Review of Systems  Genitourinary: Negative for difficulty urinating, discharge, dysuria, enuresis, flank pain, frequency, genital sores, hematuria, penile pain, penile swelling, scrotal swelling, testicular pain and urgency.     Physical Exam Triage Vital Signs ED Triage Vitals  Enc Vitals Group     BP 02/07/19 0830 109/71     Pulse Rate 02/07/19 0830 79     Resp 02/07/19 0830 16     Temp 02/07/19 0830 98.1 F (36.7 C)     Temp Source 02/07/19 0830 Oral     SpO2 02/07/19 0830 100 %     Weight --      Height --      Head Circumference --      Peak Flow --      Pain Score 02/07/19 0903 0     Pain Loc --      Pain Edu? --      Excl. in GC? --    No data found.  Updated Vital Signs BP 109/71 (BP Location: Right Arm)   Pulse 79   Temp 98.1 F (36.7 C) (Oral)   Resp 16   SpO2 100%   Visual Acuity Right Eye Distance:   Left Eye Distance:   Bilateral  Distance:    Right Eye Near:   Left Eye Near:    Bilateral Near:     Physical Exam Vitals signs and nursing note reviewed.  Constitutional:      Appearance: He is well-developed.  HENT:     Head: Normocephalic and atraumatic.  Eyes:     Conjunctiva/sclera: Conjunctivae normal.  Neck:     Musculoskeletal: Neck supple.  Pulmonary:     Effort: Pulmonary effort is normal.  Abdominal:     Palpations: Abdomen is soft.     Tenderness: There is no abdominal tenderness.  Skin:    General: Skin is warm and dry.  Neurological:     Mental Status: He is alert.      UC Treatments / Results  Labs (all labs ordered are listed, but only abnormal results are displayed) Labs Reviewed  HIV ANTIBODY (ROUTINE TESTING W REFLEX)  RPR  URINE CYTOLOGY ANCILLARY ONLY    EKG None  Radiology No results found.  Procedures Procedures (including critical care time)  Medications  Ordered in UC Medications - No data to display  Initial Impression / Assessment and Plan / UC Course  I have reviewed the triage vital signs and the nursing notes.  Pertinent labs & imaging results that were available during my care of the patient were reviewed by me and considered in my medical decision making (see chart for details).     Routine STD testing performed Labs pending Final Clinical Impressions(s) / UC Diagnoses   Final diagnoses:  Screening examination for STD (sexually transmitted disease)     Discharge Instructions     We are testing you for STDs Your labs are pending.  We will call with any positive results.  You can check your results on MyChart for updates.    ED Prescriptions    None     Controlled Substance Prescriptions [place] Controlled Substance Registry consulted? Not Applicable   Janace Aris, NP 02/07/19 [phone]

## 2019-02-08 LAB — URINE CYTOLOGY ANCILLARY ONLY
CHLAMYDIA, DNA PROBE: NEGATIVE
NEISSERIA GONORRHEA: NEGATIVE
Trichomonas: NEGATIVE

## 2019-02-08 LAB — RPR: RPR: NONREACTIVE

## 2019-02-08 LAB — HIV ANTIBODY (ROUTINE TESTING W REFLEX): HIV SCREEN 4TH GENERATION: NONREACTIVE

## 2020-03-22 ENCOUNTER — Ambulatory Visit: Payer: Managed Care, Other (non HMO) | Attending: Family

## 2020-03-22 DIAGNOSIS — Z23 Encounter for immunization: Secondary | ICD-10-CM

## 2020-03-22 NOTE — Progress Notes (Signed)
   Covid-19 Vaccination Clinic  Name:  Steven Gross    MRN: [id number] DOB: 06-11-1991  03/22/2020  Mr. Steven Gross was observed post Covid-19 immunization for 15 minutes without incident. He was provided with Vaccine Information Sheet and instruction to access the V-Safe system.   Mr. Steven Gross was instructed to call 911 with any severe reactions post vaccine: Marland Kitchen Difficulty breathing  . Swelling of face and throat  . A fast heartbeat  . A bad rash all over body  . Dizziness and weakness   Immunizations Administered    Name Date Dose VIS Date Route   Moderna COVID-19 Vaccine 03/22/2020  3:21 PM 0.5 mL 11/22/2019 Intramuscular   Manufacturer: Moderna   Lot: [id number]   NDC: 02725-366-44

## 2020-04-24 ENCOUNTER — Ambulatory Visit: Payer: Managed Care, Other (non HMO) | Attending: Family

## 2020-04-24 DIAGNOSIS — Z23 Encounter for immunization: Secondary | ICD-10-CM

## 2020-04-24 NOTE — Progress Notes (Signed)
   Covid-19 Vaccination Clinic  Name:  Steven Gross    MRN: [id number] DOB: [birth year]  04/24/2020  Mr. Steven Gross was observed post Covid-19 immunization for 15 minutes without incident. He was provided with Vaccine Information Sheet and instruction to access the V-Safe system.   Mr. Steven Gross was instructed to call 911 with any severe reactions post vaccine: Marland Kitchen Difficulty breathing  . Swelling of face and throat  . A fast heartbeat  . A bad rash all over body  . Dizziness and weakness   Immunizations Administered    Name Date Dose VIS Date Route   Moderna COVID-19 Vaccine 04/24/2020  3:39 PM 0.5 mL 11/2019 Intramuscular   Manufacturer: Moderna   Lot: [id number]   NDC: 34758-307-46

## 2022-09-13 ENCOUNTER — Emergency Department (HOSPITAL_BASED_OUTPATIENT_CLINIC_OR_DEPARTMENT_OTHER)
Admission: EM | Admit: 2022-09-13 | Discharge: 2022-09-13 | Disposition: A | Discharge status: Home / Self Care | Payer: 59 | Attending: Emergency Medicine | Admitting: Emergency Medicine

## 2022-09-13 ENCOUNTER — Encounter (HOSPITAL_BASED_OUTPATIENT_CLINIC_OR_DEPARTMENT_OTHER): Payer: Self-pay | Admitting: Emergency Medicine

## 2022-09-13 ENCOUNTER — Other Ambulatory Visit: Payer: Self-pay

## 2022-09-13 DIAGNOSIS — Z7189 Other specified counseling: Secondary | ICD-10-CM | POA: Insufficient documentation

## 2022-09-13 DIAGNOSIS — N529 Male erectile dysfunction, unspecified: Secondary | ICD-10-CM | POA: Diagnosis present

## 2022-09-13 LAB — URINALYSIS, ROUTINE W REFLEX MICROSCOPIC
Bilirubin Urine: NEGATIVE
Glucose, UA: NEGATIVE mg/dL
Hgb urine dipstick: NEGATIVE
Ketones, ur: NEGATIVE mg/dL
Leukocytes,Ua: NEGATIVE
Nitrite: NEGATIVE
Protein, ur: NEGATIVE mg/dL
Specific Gravity, Urine: 1.015 (ref 1.005–1.030)
pH: 7 (ref 5.0–8.0)

## 2022-09-13 NOTE — ED Triage Notes (Signed)
State his erections are not  lasting and he is having trouble maintaining his erection  x a few months , denies dysuria , pain  , not on any BP meds  denies drugs

## 2022-09-13 NOTE — ED Provider Notes (Signed)
[name] EMERGENCY DEPARTMENT Provider Note   CSN: [id number] Arrival date & time: 09/13/22  1518     History  Chief Complaint  Patient presents with   Medical Management of Chronic Issues    Steven Gross is a 31 y.o. male.  31 yo M with a chief complaints of erectile dysfunction.  This been going on for couple months.  He has had no difficulty masturbating.  Denies any pain to the penis or the testicles.  He has been under a bit of pressure recently.  Denies any over-the-counter medications denies any supplements.        Home Medications Prior to Admission medications   Not on File      Allergies    Patient has no known allergies.    Review of Systems   Review of Systems  Physical Exam Updated Vital Signs BP 125/86 (BP Location: Left Arm)   Pulse 79   Temp 98.3 F (36.8 C) (Oral)   Resp 16   Ht 5\' 11"  (1.803 m)   Wt 69.9 kg   SpO2 100%   BMI 21.49 kg/m  Physical Exam Vitals and nursing note reviewed.  Constitutional:      Appearance: He is well-developed.  HENT:     Head: Normocephalic and atraumatic.  Eyes:     Pupils: Pupils are equal, round, and reactive to light.  Neck:     Vascular: No JVD.  Cardiovascular:     Rate and Rhythm: Normal rate and regular rhythm.     Heart sounds: No murmur heard.    No friction rub. No gallop.  Pulmonary:     Effort: No respiratory distress.     Breath sounds: No wheezing.  Abdominal:     General: There is no distension.     Tenderness: There is no abdominal tenderness. There is no guarding or rebound.  Genitourinary:    Comments: No appreciable inguinal lymphadenopathy Musculoskeletal:        General: Normal range of motion.     Cervical back: Normal range of motion and neck supple.  Skin:    Coloration: Skin is not pale.     Findings: No rash.  Neurological:     Mental Status: He is alert and oriented to person, place, and time.  Psychiatric:        Behavior: Behavior normal.     ED  Results / Procedures / Treatments   Labs (all labs ordered are listed, but only abnormal results are displayed) Labs Reviewed  URINALYSIS, ROUTINE W REFLEX MICROSCOPIC    EKG None  Radiology No results found.  Procedures Procedures    Medications Ordered in ED Medications - No data to display  ED Course/ Medical Decision Making/ A&P                           Medical Decision Making Amount and/or Complexity of Data Reviewed Labs: ordered.   31 yo M with a chief complaints of erectile dysfunction.  The patient is having trouble initiating and maintaining erection.  He has been able to masturbate without issue.   I encouraged him to follow-up with a primary care provider.  His UA here was negative for infection.  5:55 PM:  I have discussed the diagnosis/risks/treatment options with the patient.  Evaluation and diagnostic testing in the emergency department does not suggest an emergent condition requiring admission or immediate intervention beyond what has been performed at this  time.  They will follow up with PCP. We also discussed returning to the ED immediately if new or worsening sx occur. We discussed the sx which are most concerning (e.g., sudden worsening pain, fever, inability to tolerate by mouth) that necessitate immediate return. Medications administered to the patient during their visit and any new prescriptions provided to the patient are listed below.  Medications given during this visit Medications - No data to display   The patient appears reasonably screen and/or stabilized for discharge and I doubt any other medical condition or other [name] requiring further screening, evaluation, or treatment in the ED at this time prior to discharge.          Final Clinical Impression(s) / ED Diagnoses Final diagnoses:  Erectile dysfunction, unspecified erectile dysfunction type    Rx / DC Orders ED Discharge Orders     None         Steven Etienne, DO 09/13/22  1755

## 2022-09-13 NOTE — Discharge Instructions (Signed)
Please follow-up with the family doctor in the office to try and help you with this problem.

## 2022-09-13 NOTE — ED Notes (Addendum)
Patient states that he has had this issue for 2-3.States that he can get an erection,but it does not last. States that he is not taking any medication for erectile disfunction Denies any dysuria or hematuria

## 2023-04-14 ENCOUNTER — Encounter (INDEPENDENT_AMBULATORY_CARE_PROVIDER_SITE_OTHER): Payer: 59 | Admitting: Ophthalmology

## 2023-04-23 NOTE — Progress Notes (Signed)
[facility] - Clinic Note  04/27/2023     CHIEF COMPLAINT Patient presents for Retina Evaluation   HISTORY OF PRESENT ILLNESS: Steven Gross is a 32 y.o. male who presents to the clinic today for:   HPI     Retina Evaluation   In both eyes.  This started years ago.  Duration of years.  I, the attending physician,  performed the HPI with the patient and updated documentation appropriately.        Comments   Pt here for new pt eval to establish w/ a retinal specialist due to hx of RD OS. Pt does not report any new issues, no floaters or FOL. Unsure when the RD OS was. Has been years. Pt reports VA is stable.       Last edited by Rennis Chris, MD on 04/29/2023  5:52 AM.      Referring physician: Elpidio Galea, MD [facility] [address] [place],  [place] [address]  HISTORICAL INFORMATION:   Selected notes from the MEDICAL RECORD NUMBER Referred by Dr. Noel Gerold for retina eval -- h/o ROP s/p laser  LEE:  Ocular Hx- PMH-    CURRENT MEDICATIONS: No current outpatient medications on file. (Ophthalmic Drugs)   No current facility-administered medications for this visit. (Ophthalmic Drugs)   No current outpatient medications on file. (Other)   No current facility-administered medications for this visit. (Other)   REVIEW OF SYSTEMS: ROS   Positive for: Eyes Negative for: Constitutional, Gastrointestinal, Neurological, Skin, Genitourinary, Musculoskeletal, HENT, Endocrine, Cardiovascular, Respiratory, Psychiatric, Allergic/Imm, Heme/Lymph Last edited by Thompson Grayer, COT on 04/27/2023  8:55 AM.     ALLERGIES No Known Allergies  PAST MEDICAL HISTORY History reviewed. No pertinent past medical history. Past Surgical History:  Procedure Laterality Date   RETINAL DETACHMENT SURGERY Left    FAMILY HISTORY Family History  Problem Relation Age of Onset   Healthy Mother    Healthy Father     SOCIAL HISTORY Social History    Tobacco Use   Smoking status: Never    Passive exposure: Never   Smokeless tobacco: Never  Substance Use Topics   Alcohol use: No   Drug use: Never       OPHTHALMIC EXAM:  Base Eye Exam     Visual Acuity (Snellen - Linear)       Right Left   Dist cc 20/20 20/60 -1   Dist ph cc  NI    Correction: Glasses  Missing central letter on each line MS        Tonometry (Tonopen, 9:12 AM)       Right Left   Pressure 15 16         Pupils       Pupils Dark Light Shape React APD   Right PERRL 3 2 Round Brisk None   Left PERRL 3 2 Round Brisk None         Visual Fields (Counting fingers)       Left Right    Full Full         Extraocular Movement       Right Left    Full, Ortho Full, Ortho         Neuro/Psych     Oriented x3: Yes   Mood/Affect: Normal         Dilation     Both eyes: 1.0% Mydriacyl, 2.5% Phenylephrine @ 9:13 AM  Slit Lamp and Fundus Exam     External Exam       Right Left   External Normal Normal         Slit Lamp Exam       Right Left   Lids/Lashes Normal Normal   Conjunctiva/Sclera White and quiet White and quiet   Cornea Clear Corneal haze/scar   Anterior Chamber Deep and clear Deep and clear   Iris Round and dilated, No NVI Round and dilated, No NVI   Lens Clear Clear   Anterior Vitreous Normal Vitreous syneresis         Fundus Exam       Right Left   Disc Pink and sharp Pink and sharp, vascular loops   C/D Ratio 0.5 0.5   Macula Flat, Good foveal reflex Flat, Good foveal reflex   Vessels Mild Tortuous, peripheral attenuation Tortuous, peripheral attenutaion, mild perpheral fibriosis   Periphery Attached, mild peripheral fibrosis 360, no heme, pigmented CR scarring temporally Attached, dense pigmented CR scarring 360 greatest temporally, no heme, mild peripheral fibrosis posterior to laser           Refraction     Wearing Rx       Sphere Cylinder Axis   Right -6.00 +0.25 096   Left  -8.25 +1.50 036         Manifest Refraction       Sphere Cylinder Axis Dist VA   Right       Left -8.75 +1.00 045 20/50+2            IMAGING AND PROCEDURES  Imaging and Procedures for 04/27/2023  OCT, Retina - OU - Both Eyes       Right Eye Quality was good. Central Foveal Thickness: 311. Progression has no prior data. Findings include normal foveal contour, no IRF, no SRF, macular pucker (Mild pucker).   Left Eye Quality was good. Central Foveal Thickness: 323. Progression has no prior data. Findings include normal foveal contour, no IRF, no SRF, outer retinal atrophy, vitreomacular adhesion (Large patch ORA caught on widefield).   Notes *Images captured and stored on drive  Diagnosis / Impression:  NFP; no IRF/SRF OU OD: Mild pucker OS: Large patch ORA caught on widefield  Clinical management:  See below  Abbreviations: NFP - Normal foveal profile. CME - cystoid macular edema. PED - pigment epithelial detachment. IRF - intraretinal fluid. SRF - subretinal fluid. EZ - ellipsoid zone. ERM - epiretinal membrane. ORA - outer retinal atrophy. ORT - outer retinal tubulation. SRHM - subretinal hyper-reflective material. IRHM - intraretinal hyper-reflective material      Fluorescein Angiography Optos (Transit OD)       Right Eye Progression has no prior data. Early phase findings include vascular perfusion defect (Vascular non-perfusion temporal periphery). Mid/Late phase findings include vascular perfusion defect (Vascular non-perfusion temporal periphery, no NV or leakage).   Left Eye Progression has no prior data. Early phase findings include staining, window defect. Mid/Late phase findings include staining, window defect, vascular perfusion defect (360 staining and window defect from peripheral ROP laser, focal vascualar non-perfusion far nasal periphery, no NV).   Notes **Images stored on drive**  Impression: History of ROP OU (OS>OD) OD: Vascular non-perfusion  temporal periphery, no leakage or NV OS: 360 staining and window defect from peripheral ROP laser, focal vascualar non-perfusion far nasal periphery, no NV           ASSESSMENT/PLAN:    ICD-10-CM   1. 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 History of retinopathy of prematurity s/p laser  - unsure of gestational age or birth wt. - h/o laser as a baby, pt unsure of where treatment was performed - OCT shows OD: Mild pucker, OS: Large patch ORA caught on widefield - FA (05.06.24) shows OD: Vascular non-perfusion temporal periphery, no leakage or NV; OS: 360 staining and window defect from peripheral ROP laser, focal vascualar non-perfusion far nasal periphery, no NV - BCVA OD 20/20, OS 20/60 (pt with significant corneal scar OS) - exam shows extensive CR scarring OU; extensive laser OS, minimal OD; no significant hemorrhage or active NV OU - follow up 2-3 months, DFE, OCT  3. Corneal scarring  / haze OS  - under the expert management of Dr. Noel Gerold  Ophthalmic Meds Ordered this visit:  No orders of the defined types were placed in this encounter.    Return in about 3 months (around 07/28/2023) for f/u hx of retinopathy permaturity OU, DFE, OCT.  There are no Patient Instructions on file for this visit.   Explained the diagnoses, plan, and follow up with the patient and they expressed understanding.  Patient expressed understanding of the importance of proper follow up care.   This document serves as a record of services personally performed by Karie Chimera, MD, PhD. It was created on their behalf by Annalee Genta, COMT. The creation of this record is the provider's dictation and/or activities during the visit.  Electronically signed by: Annalee Genta, COMT 04/29/23 6:02 AM  This document serves as a  record of services personally performed by Karie Chimera, MD, PhD. It was created on their behalf by Gerilyn Nestle, COT an ophthalmic technician. The creation of this record is the provider's dictation and/or activities during the visit.    Electronically signed by:  Gerilyn Nestle, COT 05.06.24 6:02 AM  Karie Chimera, M.D., Ph.D. Diseases & Surgery of the Retina and Vitreous [facility] 05.06.24  I have reviewed the above documentation for accuracy and completeness, and I agree with the above. Karie Chimera, M.D., Ph.D. 04/29/23 6:02 AM  Abbreviations: M myopia (nearsighted); A astigmatism; H hyperopia (farsighted); P presbyopia; Mrx spectacle prescription;  CTL contact lenses; OD right eye; OS left eye; OU both eyes  XT exotropia; ET esotropia; PEK punctate epithelial keratitis; PEE punctate epithelial erosions; DES dry eye syndrome; MGD meibomian gland dysfunction; ATs artificial tears; PFAT's preservative free artificial tears; NSC nuclear sclerotic cataract; PSC posterior subcapsular cataract; ERM epi-retinal membrane; PVD posterior vitreous detachment; RD retinal detachment; DM diabetes mellitus; DR diabetic retinopathy; NPDR non-proliferative diabetic retinopathy; PDR proliferative diabetic retinopathy; CSME clinically significant macular edema; DME diabetic macular edema; dbh dot blot hemorrhages; CWS cotton wool spot; POAG primary open angle glaucoma; C/D cup-to-disc ratio; HVF humphrey visual field; GVF goldmann visual field; OCT optical coherence tomography; IOP intraocular pressure; BRVO Branch retinal vein occlusion; CRVO central retinal vein occlusion; CRAO central retinal artery occlusion; BRAO branch retinal artery occlusion; RT retinal tear; SB scleral buckle; PPV pars plana vitrectomy; VH Vitreous hemorrhage; PRP panretinal laser photocoagulation; IVK intravitreal kenalog; VMT vitreomacular traction; MH Macular hole;  NVD neovascularization of the  disc; NVE neovascularization elsewhere; AREDS age related eye disease study; ARMD  age related macular degeneration; POAG primary open angle glaucoma; EBMD epithelial/anterior basement membrane dystrophy; ACIOL anterior chamber intraocular lens; IOL intraocular lens; PCIOL posterior chamber intraocular lens; Phaco/IOL phacoemulsification with intraocular lens placement; Angelina photorefractive keratectomy; LASIK laser assisted in situ keratomileusis; HTN hypertension; DM diabetes mellitus; COPD chronic obstructive pulmonary disease

## 2023-04-27 ENCOUNTER — Ambulatory Visit (INDEPENDENT_AMBULATORY_CARE_PROVIDER_SITE_OTHER): Payer: 59 | Admitting: Ophthalmology

## 2023-04-27 ENCOUNTER — Encounter (INDEPENDENT_AMBULATORY_CARE_PROVIDER_SITE_OTHER): Payer: Self-pay | Admitting: Ophthalmology

## 2023-04-27 DIAGNOSIS — H179 Unspecified corneal scar and opacity: Secondary | ICD-10-CM

## 2023-04-27 DIAGNOSIS — H31003 Unspecified chorioretinal scars, bilateral: Secondary | ICD-10-CM | POA: Diagnosis not present

## 2023-04-27 DIAGNOSIS — Z8669 Personal history of other diseases of the nervous system and sense organs: Secondary | ICD-10-CM

## 2023-04-29 ENCOUNTER — Encounter (INDEPENDENT_AMBULATORY_CARE_PROVIDER_SITE_OTHER): Payer: Self-pay | Admitting: Ophthalmology

## 2023-07-22 NOTE — Progress Notes (Signed)
[facility] - Clinic Note  07/29/2023     CHIEF COMPLAINT Patient presents for Retina Follow Up   HISTORY OF PRESENT ILLNESS: Steven Gross is a 32 y.o. male who presents to the clinic today for:   HPI     Retina Follow Up   Patient presents with  Other.  In both eyes.  This started 3 months ago.  Duration of 3 months.  Since onset it is stable.  I, the attending physician,  performed the HPI with the patient and updated documentation appropriately.        Comments   3 month retina follow up premature retinopathy ou pt is reporting no vision changes noticed he denies any flashes or floaters       Last edited by Rennis Chris, MD on 07/29/2023  5:32 PM.    Pt has not noticed any change in vision   Referring physician: No referring provider defined for this encounter.  HISTORICAL INFORMATION:   Selected notes from the MEDICAL RECORD NUMBER Referred by Dr. Noel Gerold for retina eval -- h/o ROP s/p laser  LEE:  Ocular Hx- PMH-    CURRENT MEDICATIONS: No current outpatient medications on file. (Ophthalmic Drugs)   No current facility-administered medications for this visit. (Ophthalmic Drugs)   No current outpatient medications on file. (Other)   No current facility-administered medications for this visit. (Other)   REVIEW OF SYSTEMS: ROS   Positive for: Eyes Negative for: Constitutional, Gastrointestinal, Neurological, Skin, Genitourinary, Musculoskeletal, HENT, Endocrine, Cardiovascular, Respiratory, Psychiatric, Allergic/Imm, Heme/Lymph Last edited by Etheleen Mayhew, COT on 07/29/2023  2:44 PM.      ALLERGIES No Known Allergies  PAST MEDICAL HISTORY History reviewed. No pertinent past medical history. Past Surgical History:  Procedure Laterality Date   RETINAL DETACHMENT SURGERY Left    FAMILY HISTORY Family History  Problem Relation Age of Onset   Healthy Mother    Healthy Father     SOCIAL HISTORY Social History   Tobacco  Use   Smoking status: Never    Passive exposure: Never   Smokeless tobacco: Never  Substance Use Topics   Alcohol use: No   Drug use: Never       OPHTHALMIC EXAM:  Base Eye Exam     Visual Acuity (Snellen - Linear)       Right Left   Dist cc 20/20 -1 20/60 -2   Dist ph cc  NI    Correction: Glasses         Tonometry (Tonopen, 2:52 PM)       Right Left   Pressure 16 17         Pupils       Pupils Dark Light Shape React APD   Right PERRL 3 2 Round Brisk None   Left PERRL 3 2 Round Brisk None         Visual Fields       Left Right    Full Full         Extraocular Movement       Right Left    Full, Ortho Full, Ortho         Neuro/Psych     Oriented x3: Yes   Mood/Affect: Normal         Dilation     Both eyes: 2.5% Phenylephrine @ 2:52 PM           Slit Lamp and Fundus Exam     External Exam  Right Left   External Normal Normal         Slit Lamp Exam       Right Left   Lids/Lashes Normal Normal   Conjunctiva/Sclera White and quiet White and quiet   Cornea Clear Corneal haze/scar centrally, early band K nasally   Anterior Chamber Deep and clear Deep and clear   Iris Round and dilated, No NVI Round and dilated, No NVI   Lens Clear Clear   Anterior Vitreous mild syneresis Vitreous syneresis         Fundus Exam       Right Left   Disc Pink and sharp Pink and sharp, vascular loops   C/D Ratio 0.5 0.5   Macula Flat, Good foveal reflex, No heme or edema Flat, Good foveal reflex, No heme or edema   Vessels attenuated, Tortuous, no NV Attenuated, tortuous, mild peripheral fibriosis, no NV   Periphery Attached, mild peripheral fibrosis 360, no heme, pigmented CR scarring temporally, focal pigmented lattice at 0500 Attached, dense pigmented CR scarring 360 greatest temporally, no heme, mild peripheral fibrosis posterior to laser           Refraction     Wearing Rx       Sphere Cylinder Axis   Right -6.00 +0.25  096   Left -8.25 +1.50 036            IMAGING AND PROCEDURES  Imaging and Procedures for 07/29/2023  OCT, Retina - OU - Both Eyes       Right Eye Quality was good. Central Foveal Thickness: 311. Progression has been stable. Findings include normal foveal contour, no IRF, no SRF, macular pucker (Mild pucker).   Left Eye Quality was good. Central Foveal Thickness: 317. Progression has been stable. Findings include normal foveal contour, no IRF, no SRF, outer retinal atrophy, vitreomacular adhesion (Large patch ORA caught on widefield).   Notes *Images captured and stored on drive  Diagnosis / Impression:  NFP; no IRF/SRF OU OD: Mild pucker OS: Large patch ORA caught on widefield  Clinical management:  See below  Abbreviations: NFP - Normal foveal profile. CME - cystoid macular edema. PED - pigment epithelial detachment. IRF - intraretinal fluid. SRF - subretinal fluid. EZ - ellipsoid zone. ERM - epiretinal membrane. ORA - outer retinal atrophy. ORT - outer retinal tubulation. SRHM - subretinal hyper-reflective material. IRHM - intraretinal hyper-reflective material            ASSESSMENT/PLAN:    ICD-10-CM   1. 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 History of retinopathy of prematurity s/p laser  - unsure of gestational age or birth wt. - h/o laser as a baby, pt unsure of where treatment was performed - OCT stable OU -- OD: Mild pucker, OS: Large patch ORA caught on widefield - FA (05.06.24) shows OD: Vascular non-perfusion temporal periphery, no leakage or NV; OS: 360 staining and window defect from peripheral ROP laser, focal vascualar non-perfusion far nasal periphery, no NV - BCVA stable OU -- OD 20/20, OS 20/60 (pt with significant corneal scar OS) - exam shows extensive CR  scarring OU; extensive laser OS, minimal OD; no significant hemorrhage or active NV OU - follow up 9 months, DFE,  OCT  3. Lattice degeneration w/ atrophic holes, right eye - focal pigmented lattice at 0500 - discussed findings, prognosis, and treatment options including observation - recommend monitoring for now - f/u 9 months, DFE, OCT  4. Corneal scarring  / haze OS  - under the expert management of Dr. Noel Gerold  Ophthalmic Meds Ordered this visit:  No orders of the defined types were placed in this encounter.    Return in about 1 year (around 07/28/2024) for h/o ROP OU, DFE, OCT.  There are no Patient Instructions on file for this visit.   Explained the diagnoses, plan, and follow up with the patient and they expressed understanding.  Patient expressed understanding of the importance of proper follow up care.   This document serves as a record of services personally performed by Karie Chimera, MD, PhD. It was created on their behalf by De Blanch, an ophthalmic technician. The creation of this record is the provider's dictation and/or activities during the visit.    Electronically signed by: De Blanch, OA, 07/29/23  5:34 PM  This document serves as a record of services personally performed by Karie Chimera, MD, PhD. It was created on their behalf by Glee Arvin. Manson Passey, OA an ophthalmic technician. The creation of this record is the provider's dictation and/or activities during the visit.    Electronically signed by: Glee Arvin. Manson Passey, OA 07/29/23 5:34 PM  Karie Chimera, M.D., Ph.D. Diseases & Surgery of the Retina and Vitreous [facility] 08.07.24  I have reviewed the above documentation for accuracy and completeness, and I agree with the above. Karie Chimera, M.D., Ph.D. 07/29/23 5:35 PM   Abbreviations: M myopia (nearsighted); A astigmatism; H hyperopia (farsighted); P presbyopia; Mrx spectacle prescription;  CTL contact lenses; OD right  eye; OS left eye; OU both eyes  XT exotropia; ET esotropia; PEK punctate epithelial keratitis; PEE punctate epithelial erosions; DES dry eye syndrome; MGD meibomian gland dysfunction; ATs artificial tears; PFAT's preservative free artificial tears; NSC nuclear sclerotic cataract; PSC posterior subcapsular cataract; ERM epi-retinal membrane; PVD posterior vitreous detachment; RD retinal detachment; DM diabetes mellitus; DR diabetic retinopathy; NPDR non-proliferative diabetic retinopathy; PDR proliferative diabetic retinopathy; CSME clinically significant macular edema; DME diabetic macular edema; dbh dot blot hemorrhages; CWS cotton wool spot; POAG primary open angle glaucoma; C/D cup-to-disc ratio; HVF humphrey visual field; GVF goldmann visual field; OCT optical coherence tomography; IOP intraocular pressure; BRVO Branch retinal vein occlusion; CRVO central retinal vein occlusion; CRAO central retinal artery occlusion; BRAO branch retinal artery occlusion; RT retinal tear; SB scleral buckle; PPV pars plana vitrectomy; VH Vitreous hemorrhage; PRP panretinal laser photocoagulation; IVK intravitreal kenalog; VMT vitreomacular traction; MH Macular hole;  NVD neovascularization of the disc; NVE neovascularization elsewhere; AREDS age related eye disease study; ARMD age related macular degeneration; POAG primary open angle glaucoma; EBMD epithelial/anterior basement membrane dystrophy; ACIOL anterior chamber intraocular lens; IOL intraocular lens; PCIOL posterior chamber intraocular lens; Phaco/IOL phacoemulsification with intraocular lens placement; PRK photorefractive keratectomy; LASIK laser assisted in situ keratomileusis; HTN hypertension; DM diabetes mellitus; COPD chronic obstructive pulmonary disease

## 2023-07-29 ENCOUNTER — Encounter (INDEPENDENT_AMBULATORY_CARE_PROVIDER_SITE_OTHER): Payer: Self-pay | Admitting: Ophthalmology

## 2023-07-29 ENCOUNTER — Ambulatory Visit (INDEPENDENT_AMBULATORY_CARE_PROVIDER_SITE_OTHER): Payer: 59 | Admitting: Ophthalmology

## 2023-07-29 DIAGNOSIS — H179 Unspecified corneal scar and opacity: Secondary | ICD-10-CM

## 2023-07-29 DIAGNOSIS — H35411 Lattice degeneration of retina, right eye: Secondary | ICD-10-CM | POA: Diagnosis not present

## 2023-07-29 DIAGNOSIS — H31003 Unspecified chorioretinal scars, bilateral: Secondary | ICD-10-CM | POA: Diagnosis not present

## 2023-07-29 DIAGNOSIS — Z8669 Personal history of other diseases of the nervous system and sense organs: Secondary | ICD-10-CM | POA: Diagnosis not present

## 2024-01-06 ENCOUNTER — Encounter (INDEPENDENT_AMBULATORY_CARE_PROVIDER_SITE_OTHER): Payer: 59 | Admitting: Ophthalmology

## 2024-01-21 NOTE — Progress Notes (Signed)
 [facility] - Clinic Note  02/02/2024     CHIEF COMPLAINT Patient presents for Retina Follow Up   HISTORY OF PRESENT ILLNESS: Steven Gross is a 33 y.o. male who presents to the clinic today for:   HPI     Retina Follow Up   Patient presents with  Other.  This started 6 months ago.  I, the attending physician,  performed the HPI with the patient and updated documentation appropriately.        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 Patient states vision doing pretty good. No issues. No eye pain.      Last edited by Rennis Chris, MD on 02/02/2024  3:46 PM.    Pt states vision is the same, no problems noted, pt thinks his glasses Rx is 20+ years old, he is not currently following with Dr. Noel Gerold   Referring physician: Elpidio Galea, MD [facility] [address] [place],  [place] [address]  HISTORICAL INFORMATION:   Selected notes from the MEDICAL RECORD NUMBER Referred by Dr. Noel Gerold for retina eval -- h/o ROP s/p laser  LEE:  Ocular Hx- PMH-    CURRENT MEDICATIONS: No current outpatient medications on file. (Ophthalmic Drugs)   No current facility-administered medications for this visit. (Ophthalmic Drugs)   No current outpatient medications on file. (Other)   No current facility-administered medications for this visit. (Other)   REVIEW OF SYSTEMS: ROS   Positive for: Eyes Negative for: Constitutional, Gastrointestinal, Neurological, Skin, Genitourinary, Musculoskeletal, HENT, Endocrine, Cardiovascular, Respiratory, Psychiatric, Allergic/Imm, Heme/Lymph Last edited by Laddie Aquas, COA on 02/02/2024  3:00 PM.     ALLERGIES No Known Allergies  PAST MEDICAL HISTORY History reviewed. No pertinent past medical history. Past Surgical History:  Procedure Laterality Date   RETINAL DETACHMENT SURGERY Left    FAMILY HISTORY Family History  Problem Relation Age of Onset    Healthy Mother    Healthy Father    SOCIAL HISTORY Social History   Tobacco Use   Smoking status: Never    Passive exposure: Never   Smokeless tobacco: Never  Substance Use Topics   Alcohol use: No   Drug use: Never       OPHTHALMIC EXAM:  Base Eye Exam     Visual Acuity (Snellen - Linear)       Right Left   Dist cc 20/20 20/60 -2   Dist ph cc  NI    Correction: Glasses         Tonometry (Tonopen, 2:57 PM)       Right Left   Pressure 12 13         Pupils       Dark Light Shape React APD   Right 3 2 Round Brisk None   Left 3 2 Round Brisk None         Visual Fields (Counting fingers)       Left Right    Full Full         Extraocular Movement       Right Left    Full, Ortho Full, Ortho         Neuro/Psych     Oriented x3: Yes   Mood/Affect: Normal         Dilation     Both eyes: 1.0% Mydriacyl, 2.5% Phenylephrine @ 2:57 PM           Slit  Lamp and Fundus Exam     External Exam       Right Left   External Normal Normal         Slit Lamp Exam       Right Left   Lids/Lashes Normal Normal   Conjunctiva/Sclera White and quiet White and quiet   Cornea Clear Corneal haze/scar centrally, early band K nasally   Anterior Chamber Deep and clear Deep and clear   Iris Round and dilated, No NVI, +PPM Round and dilated, No NVI, +PPM   Lens Clear Clear   Anterior Vitreous mild syneresis Vitreous syneresis         Fundus Exam       Right Left   Disc Pink and sharp, mild PPP Pink and sharp, vascular loops   C/D Ratio 0.5 0.5   Macula Flat, Good foveal reflex, No heme or edema Flat, Good foveal reflex, No heme or edema   Vessels attenuated, Tortuous, no NV Attenuated, tortuous, mild peripheral fibriosis, no NV   Periphery Attached, mild peripheral fibrosis 360, no heme, pigmented CR scarring temporally, focal pigmented lattice and atrophic hole at 0500, ?atrophic hole at 1000 Attached, dense pigmented CR scarring 360 greatest  temporally, no heme, mild peripheral fibrosis posterior to laser, No RT/RD           Refraction     Wearing Rx       Sphere Cylinder Axis   Right -6.00 +0.25 096   Left -8.25 +1.50 036            IMAGING AND PROCEDURES  Imaging and Procedures for 02/02/2024  OCT, Retina - OU - Both Eyes       Right Eye Quality was good. Central Foveal Thickness: 311. Progression has been stable. Findings include normal foveal contour, no IRF, no SRF, macular pucker, vitreomacular adhesion (Mild pucker).   Left Eye Quality was good. Central Foveal Thickness: 315. Progression has been stable. Findings include normal foveal contour, no IRF, no SRF, outer retinal atrophy, vitreomacular adhesion (Large patch ORA temporal periphery caught on widefield).   Notes *Images captured and stored on drive  Diagnosis / Impression:  NFP; no IRF/SRF OU OD: Mild pucker OS: Large patch of ORA temporal periphery caught on widefield  Clinical management:  See below  Abbreviations: NFP - Normal foveal profile. CME - cystoid macular edema. PED - pigment epithelial detachment. IRF - intraretinal fluid. SRF - subretinal fluid. EZ - ellipsoid zone. ERM - epiretinal membrane. ORA - outer retinal atrophy. ORT - outer retinal tubulation. SRHM - subretinal hyper-reflective material. IRHM - intraretinal hyper-reflective material            ASSESSMENT/PLAN:    ICD-10-CM   1. Retinal hole of right eye  H33.321     2. 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   - Potential treatment options including laser retinopexy and cryotherapy discussed with patient. - atrophic hole at 0500 and ?1000 - recommend laser  retinopexy OD - pt wishes to wait to do laser next week due to being on call this week - f/u Tuesday, February 18th at 245  for laser retinopexy OD  2,3. History of retinopathy of prematurity s/p laser  - unsure of gestational age or birth wt. - h/o laser as a baby, pt unsure of where treatment was performed - OCT stable OU -- OD: Mild pucker, OS: Large patch ORA caught on widefield - FA (05.06.24) shows OD: Vascular non-perfusion temporal periphery, no leakage or NV; OS: 360 staining and window defect from peripheral ROP laser, focal vascualar non-perfusion far nasal periphery, no NV - BCVA stable OU -- OD 20/20, OS 20/60 (pt with significant corneal scar OS) - exam shows extensive CR scarring OU; extensive laser OS, minimal OD; no significant hemorrhage or active NV OU - follow up 9 months, DFE, OCT  4. Lattice degeneration w/ atrophic holes, right eye - focal pigmented lattice w/ hole at 0500 - discussed findings, prognosis, and treatment options including observation - recommend laser retinopexy OD - pt wishes to wait to do laser next week due to being on call this week - f/u Tuesday, February 18th at 245 for laser retinopexy OD  5. Corneal scarring  / haze OS  - under the expert management of Dr. Noel Gerold  Ophthalmic Meds Ordered this visit:  No orders of the defined types were placed in this encounter.    Return in about 1 week (around 02/09/2024) for f/u retinal hole OD, DFE, OCT, laser retinopexy OD.  There are no Patient Instructions on file for this visit.   Explained the diagnoses, plan, and follow up with the patient and they expressed understanding.  Patient expressed understanding of the importance of proper follow up care.   This document serves as a record of services personally performed by Karie Chimera, MD, PhD. It was created on their behalf by Charlette Caffey, COT an ophthalmic technician. The creation of this record is the provider's dictation and/or  activities during the visit.    Electronically signed by:  Charlette Caffey, COT  02/02/24 3:51 PM  This document serves as a record of services personally performed by Karie Chimera, MD, PhD. It was created on their behalf by Glee Arvin. Manson Passey, OA an ophthalmic technician. The creation of this record is the provider's dictation and/or activities during the visit.    Electronically signed by: Glee Arvin. Manson Passey, OA 02/02/24 3:51 PM  Karie Chimera, M.D., Ph.D. Diseases & Surgery of the Retina and Vitreous [facility]  I have reviewed the above documentation for accuracy and completeness, and I agree with the above. Karie Chimera, M.D., Ph.D. 02/02/24 3:51 PM   Abbreviations: M myopia (nearsighted); A astigmatism; H hyperopia (farsighted); P presbyopia; Mrx spectacle prescription;  CTL contact lenses; OD right eye; OS left eye; OU both eyes  XT exotropia; ET esotropia; PEK punctate epithelial keratitis; PEE punctate epithelial erosions; DES dry eye syndrome; MGD meibomian gland dysfunction; ATs artificial tears; PFAT's preservative free artificial tears; NSC nuclear sclerotic cataract; PSC posterior subcapsular cataract; ERM epi-retinal membrane; PVD posterior vitreous detachment; RD retinal detachment; DM diabetes mellitus; DR diabetic retinopathy; NPDR non-proliferative diabetic retinopathy; PDR proliferative diabetic retinopathy; CSME clinically significant macular edema; DME diabetic macular edema; dbh dot blot hemorrhages; CWS cotton wool spot; POAG primary open angle glaucoma; C/D cup-to-disc ratio; HVF humphrey visual field; GVF goldmann visual field; OCT optical coherence tomography; IOP intraocular pressure; BRVO Branch retinal vein occlusion; CRVO central retinal vein occlusion; CRAO central retinal artery occlusion; BRAO branch retinal artery occlusion; RT retinal tear; SB scleral buckle; PPV pars plana vitrectomy;  VH Vitreous hemorrhage; PRP panretinal laser  photocoagulation; IVK intravitreal kenalog; VMT vitreomacular traction; MH Macular hole;  NVD neovascularization of the disc; NVE neovascularization elsewhere; AREDS age related eye disease study; ARMD age related macular degeneration; POAG primary open angle glaucoma; EBMD epithelial/anterior basement membrane dystrophy; ACIOL anterior chamber intraocular lens; IOL intraocular lens; PCIOL posterior chamber intraocular lens; Phaco/IOL phacoemulsification with intraocular lens placement; PRK photorefractive keratectomy; LASIK laser assisted in situ keratomileusis; HTN hypertension; DM diabetes mellitus; COPD chronic obstructive pulmonary disease

## 2024-02-02 ENCOUNTER — Ambulatory Visit (INDEPENDENT_AMBULATORY_CARE_PROVIDER_SITE_OTHER): Payer: 59 | Admitting: Ophthalmology

## 2024-02-02 ENCOUNTER — Encounter (INDEPENDENT_AMBULATORY_CARE_PROVIDER_SITE_OTHER): Payer: Self-pay | Admitting: Ophthalmology

## 2024-02-02 DIAGNOSIS — Z8669 Personal history of other diseases of the nervous system and sense organs: Secondary | ICD-10-CM

## 2024-02-02 DIAGNOSIS — H33321 Round hole, right eye: Secondary | ICD-10-CM | POA: Diagnosis not present

## 2024-02-02 DIAGNOSIS — H35411 Lattice degeneration of retina, right eye: Secondary | ICD-10-CM | POA: Diagnosis not present

## 2024-02-02 DIAGNOSIS — H179 Unspecified corneal scar and opacity: Secondary | ICD-10-CM

## 2024-02-02 DIAGNOSIS — H31003 Unspecified chorioretinal scars, bilateral: Secondary | ICD-10-CM | POA: Diagnosis not present

## 2024-02-04 NOTE — Progress Notes (Signed)
 [facility] - Clinic Note  02/09/2024     CHIEF COMPLAINT Patient presents for Retina Follow Up   HISTORY OF PRESENT ILLNESS: Steven Gross is a 33 y.o. male who presents to the clinic today for:   HPI     Retina Follow Up   In right eye.  This started 1 week ago.  Duration of 1 week.  Since onset it is stable.  I, the attending physician,  performed the HPI with the patient and updated documentation appropriately.        Comments   1 week retina follow up ret hole OD and pexy OD pt is reporting no vision changes noticed he denies any flashes or floaters       Last edited by Rennis Chris, MD on 02/09/2024  5:00 PM.    Pt here for laser retinopexy OD   Referring physician: No referring provider defined for this encounter.  HISTORICAL INFORMATION:   Selected notes from the MEDICAL RECORD NUMBER Referred by Dr. Noel Gerold for retina eval -- h/o ROP s/p laser  LEE:  Ocular Hx- PMH-    CURRENT MEDICATIONS: Current Outpatient Medications (Ophthalmic Drugs)  Medication Sig   prednisoLONE acetate (PRED FORTE) 1 % ophthalmic suspension Place 1 drop into the right eye 4 (four) times daily for 7 days.   prednisoLONE acetate (PRED FORTE) 1 % ophthalmic suspension Place 1 drop into the right eye 4 (four) times daily for 7 days.   No current facility-administered medications for this visit. (Ophthalmic Drugs)   No current outpatient medications on file. (Other)   No current facility-administered medications for this visit. (Other)   REVIEW OF SYSTEMS: ROS   Positive for: Eyes Negative for: Constitutional, Gastrointestinal, Neurological, Skin, Genitourinary, Musculoskeletal, HENT, Endocrine, Cardiovascular, Respiratory, Psychiatric, Allergic/Imm, Heme/Lymph Last edited by Etheleen Mayhew, COT on 02/09/2024  2:33 PM.      ALLERGIES No Known Allergies  PAST MEDICAL HISTORY History reviewed. No pertinent past medical history. Past Surgical History:   Procedure Laterality Date   RETINAL DETACHMENT SURGERY Left    FAMILY HISTORY Family History  Problem Relation Age of Onset   Healthy Mother    Healthy Father    SOCIAL HISTORY Social History   Tobacco Use   Smoking status: Never    Passive exposure: Never   Smokeless tobacco: Never  Substance Use Topics   Alcohol use: No   Drug use: Never       OPHTHALMIC EXAM:  Base Eye Exam     Visual Acuity (Snellen - Linear)       Right Left   Dist cc 20/20 20/60 -2   Dist ph cc  NI    Correction: Glasses         Tonometry (Tonopen, 2:40 PM)       Right Left   Pressure 16 14         Pupils       Pupils Dark Light Shape React APD   Right PERRL 3 2 Round Brisk None   Left PERRL 3 2 Round Brisk None         Visual Fields       Left Right    Full Full         Extraocular Movement       Right Left    Full, Ortho Full, Ortho         Neuro/Psych     Oriented x3: Yes   Mood/Affect: Normal  Dilation     Right eye: 2.5% Phenylephrine @ 2:40 PM           Slit Lamp and Fundus Exam     External Exam       Right Left   External Normal Normal         Slit Lamp Exam       Right Left   Lids/Lashes Normal Normal   Conjunctiva/Sclera White and quiet White and quiet   Cornea Clear Corneal haze/scar centrally, early band K nasally   Anterior Chamber Deep and clear Deep and clear   Iris Round and dilated, No NVI, +PPM Round and dilated, No NVI, +PPM   Lens Clear Clear   Anterior Vitreous mild syneresis Vitreous syneresis         Fundus Exam       Right Left   Disc Pink and sharp, mild PPP Pink and sharp, vascular loops   C/D Ratio 0.5 0.5   Macula Flat, Good foveal reflex, No heme or edema Flat, Good foveal reflex, No heme or edema   Vessels attenuated, Tortuous, no NV Attenuated, tortuous, mild peripheral fibriosis, no NV   Periphery Attached, mild peripheral fibrosis 360, no heme, pigmented CR scarring temporally, focal  pigmented lattice and atrophic hole at 0500 and 1100. Attached, dense pigmented CR scarring 360 greatest temporally, no heme, mild peripheral fibrosis posterior to laser, No RT/RD           Refraction     Wearing Rx       Sphere Cylinder Axis   Right -6.00 +0.25 096   Left -8.25 +1.50 036            IMAGING AND PROCEDURES  Imaging and Procedures for 02/09/2024  OCT, Retina - OU - Both Eyes       Right Eye Quality was good. Central Foveal Thickness: 312. Progression has been stable. Findings include normal foveal contour, no IRF, no SRF, macular pucker, vitreomacular adhesion (Mild pucker).   Left Eye Quality was good. Central Foveal Thickness: 303. Progression has been stable. Findings include normal foveal contour, no IRF, no SRF, outer retinal atrophy, vitreomacular adhesion (Large patch ORA temporal periphery caught on widefield).   Notes *Images captured and stored on drive  Diagnosis / Impression:  NFP; no IRF/SRF OU OD: Mild pucker OS: Large patch of ORA temporal periphery caught on widefield  Clinical management:  See below  Abbreviations: NFP - Normal foveal profile. CME - cystoid macular edema. PED - pigment epithelial detachment. IRF - intraretinal fluid. SRF - subretinal fluid. EZ - ellipsoid zone. ERM - epiretinal membrane. ORA - outer retinal atrophy. ORT - outer retinal tubulation. SRHM - subretinal hyper-reflective material. IRHM - intraretinal hyper-reflective material      Repair Retinal Breaks, Laser - OD - Right Eye       LASER PROCEDURE NOTE  Procedure:  Barrier laser retinopexy using slit lamp laser, RIGHT eye   Diagnosis:   Lattice degeneration w/ atrophic holes, RIGHT eye                     Patches of lattice: 1100 superiorly; 0500 inferiorly  Surgeon: Rennis Chris, MD, PhD  Anesthesia: Topical  Informed consent obtained, operative eye marked, and time out performed prior to initiation of laser.   Laser settings:  Lumenis  [id number] laser, slit lamp Lens: Mainster PRP 165 Power: 280 mW Spot size: 200 microns Duration: 30 msec  # spots: 558  Placement of laser:  Using a Mainster PRP 165 contact lens at the slit lamp, laser was placed in three confluent rows around patches of lattice w/ atrophic holes at 0500 and 1100 oclock and around pigmented lattice from 0900-1000.  Complications: None.  Patient tolerated the procedure well and received written and verbal post-procedure care information/education.           ASSESSMENT/PLAN:    ICD-10-CM   1. 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 Retinal hole of right eye  H33.321 OCT, Retina - OU - Both Eyes    Repair Retinal Breaks, Laser - OD - Right Eye    3. 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 4. Lattice degeneration w/ atrophic holes, right eye - focal pigmented lattice w/ holes at 0500 and 1100 oclock - pigmented lattice w/o holes from 0900-1000 oclock - discussed findings, prognosis, and treatment options including observation - recommend laser retinopexy OD today, 02.18.25 - RBA of procedure discussed, questions answered - informed consent obtained and signed - see procedure note  - start PF QID OD x7 days - f/u 2-3 wks -- DFE/OCT  3,4. History of retinopathy of prematurity s/p laser  - unsure of gestational age or birth wt. - h/o laser as a baby, pt unsure of where treatment was performed - OCT stable OU -- OD: Mild pucker, OS: Large patch ORA caught on widefield - FA (05.06.24) shows OD: Vascular non-perfusion temporal periphery, no leakage or NV; OS: 360 staining and window defect from peripheral ROP laser, focal vascualar non-perfusion far nasal periphery, no NV - BCVA stable OU -- OD 20/20, OS 20/60 (pt with significant corneal scar OS) - exam shows extensive CR scarring OU;  extensive laser OS, minimal OD; no significant hemorrhage or active NV OU - follow up 9 months, DFE, OCT  5. Corneal scarring  / haze OS  - under the expert management of Dr. Noel Gerold  Ophthalmic Meds Ordered this visit:  Meds ordered this encounter  Medications   DISCONTD: prednisoLONE acetate (PRED FORTE) 1 % ophthalmic suspension    Sig: Place 1 drop into the right eye 4 (four) times daily for 7 days.    Dispense:  10 mL    Refill:  0   prednisoLONE acetate (PRED FORTE) 1 % ophthalmic suspension    Sig: Place 1 drop into the right eye 4 (four) times daily for 7 days.    Dispense:  10 mL    Refill:  0   prednisoLONE acetate (PRED FORTE) 1 % ophthalmic suspension    Sig: Place 1 drop into the right eye 4 (four) times daily for 7 days.    Dispense:  5 mL    Refill:  0     Return for f/u 2-3 weeks - lattice w/ atrophic holes OD, Dilated Exam, OCT.  There are no Patient Instructions on file for this visit.   Explained the diagnoses, plan, and follow up with the patient and they expressed understanding.  Patient expressed understanding of the importance of proper follow up care.   This document serves as a record of services personally performed by Karie Chimera, MD, PhD. It was created on their behalf by Charlette Caffey, COT an ophthalmic technician. The creation of this record is the  provider's dictation and/or activities during the visit.    Electronically signed by:  Charlette Caffey, COT  02/09/24 5:08 PM  Karie Chimera, M.D., Ph.D. Diseases & Surgery of the Retina and Vitreous [facility]  I have reviewed the above documentation for accuracy and completeness, and I agree with the above. Karie Chimera, M.D., Ph.D. 02/09/24 5:09 PM   Abbreviations: M myopia (nearsighted); A astigmatism; H hyperopia (farsighted); P presbyopia; Mrx spectacle prescription;  CTL contact lenses; OD right eye; OS left eye; OU both eyes  XT exotropia; ET esotropia;  PEK punctate epithelial keratitis; PEE punctate epithelial erosions; DES dry eye syndrome; MGD meibomian gland dysfunction; ATs artificial tears; PFAT's preservative free artificial tears; NSC nuclear sclerotic cataract; PSC posterior subcapsular cataract; ERM epi-retinal membrane; PVD posterior vitreous detachment; RD retinal detachment; DM diabetes mellitus; DR diabetic retinopathy; NPDR non-proliferative diabetic retinopathy; PDR proliferative diabetic retinopathy; CSME clinically significant macular edema; DME diabetic macular edema; dbh dot blot hemorrhages; CWS cotton wool spot; POAG primary open angle glaucoma; C/D cup-to-disc ratio; HVF humphrey visual field; GVF goldmann visual field; OCT optical coherence tomography; IOP intraocular pressure; BRVO Branch retinal vein occlusion; CRVO central retinal vein occlusion; CRAO central retinal artery occlusion; BRAO branch retinal artery occlusion; RT retinal tear; SB scleral buckle; PPV pars plana vitrectomy; VH Vitreous hemorrhage; PRP panretinal laser photocoagulation; IVK intravitreal kenalog; VMT vitreomacular traction; MH Macular hole;  NVD neovascularization of the disc; NVE neovascularization elsewhere; AREDS age related eye disease study; ARMD age related macular degeneration; POAG primary open angle glaucoma; EBMD epithelial/anterior basement membrane dystrophy; ACIOL anterior chamber intraocular lens; IOL intraocular lens; PCIOL posterior chamber intraocular lens; Phaco/IOL phacoemulsification with intraocular lens placement; PRK photorefractive keratectomy; LASIK laser assisted in situ keratomileusis; HTN hypertension; DM diabetes mellitus; COPD chronic obstructive pulmonary disease

## 2024-02-09 ENCOUNTER — Encounter (INDEPENDENT_AMBULATORY_CARE_PROVIDER_SITE_OTHER): Payer: Self-pay | Admitting: Ophthalmology

## 2024-02-09 ENCOUNTER — Ambulatory Visit (INDEPENDENT_AMBULATORY_CARE_PROVIDER_SITE_OTHER): Payer: 59 | Admitting: Ophthalmology

## 2024-02-09 DIAGNOSIS — H33321 Round hole, right eye: Secondary | ICD-10-CM

## 2024-02-09 DIAGNOSIS — H35411 Lattice degeneration of retina, right eye: Secondary | ICD-10-CM

## 2024-02-09 DIAGNOSIS — H31003 Unspecified chorioretinal scars, bilateral: Secondary | ICD-10-CM

## 2024-02-09 DIAGNOSIS — H179 Unspecified corneal scar and opacity: Secondary | ICD-10-CM

## 2024-02-09 DIAGNOSIS — Z8669 Personal history of other diseases of the nervous system and sense organs: Secondary | ICD-10-CM

## 2024-02-09 MED ORDER — PREDNISOLONE ACETATE 1 % OP SUSP: 1.0000 [drp] | Freq: Four times a day (QID) | OPHTHALMIC | 0 refills | Status: DC

## 2024-02-09 MED ORDER — PREDNISOLONE ACETATE 1 % OP SUSP: 1.0000 [drp] | Freq: Four times a day (QID) | OPHTHALMIC | 0 refills | Status: AC

## 2024-02-22 NOTE — Progress Notes (Signed)
 [facility] - Clinic Note  02/29/2024     CHIEF COMPLAINT Patient presents for Retina Follow Up   HISTORY OF PRESENT ILLNESS: Steven Gross is a 33 y.o. male who presents to the clinic today for:   HPI     Retina Follow Up   Patient presents with  Other.  In right eye.  Severity is moderate.  Duration of 3 weeks.  I, the attending physician,  performed the HPI with the patient and updated documentation appropriately.        Comments   Retina follow up, ret hole OD, and pexy OD. Pt states no vision changes. Pt has not noticed any flashes or floaters. Pt denies any discomfort. Pt is not currently using any drops.      Last edited by Rennis Chris, MD on 02/29/2024  9:32 PM.     Pt    Referring physician: No referring provider defined for this encounter.  HISTORICAL INFORMATION:   Selected notes from the MEDICAL RECORD NUMBER Referred by Dr. Noel Gerold for retina eval -- h/o ROP s/p laser  LEE:  Ocular Hx- PMH-    CURRENT MEDICATIONS: No current outpatient medications on file. (Ophthalmic Drugs)   No current facility-administered medications for this visit. (Ophthalmic Drugs)   No current outpatient medications on file. (Other)   No current facility-administered medications for this visit. (Other)   REVIEW OF SYSTEMS: ROS   Positive for: Eyes Negative for: Constitutional, Gastrointestinal, Neurological, Skin, Genitourinary, Musculoskeletal, HENT, Endocrine, Cardiovascular, Respiratory, Psychiatric, Allergic/Imm, Heme/Lymph Last edited by Elicia Lamp, COT on 02/29/2024  1:38 PM.     ALLERGIES No Known Allergies  PAST MEDICAL HISTORY History reviewed. No pertinent past medical history. Past Surgical History:  Procedure Laterality Date   RETINAL DETACHMENT SURGERY Left    FAMILY HISTORY Family History  Problem Relation Age of Onset   Healthy Mother    Healthy Father    SOCIAL HISTORY Social History   Tobacco Use   Smoking  status: Never    Passive exposure: Never   Smokeless tobacco: Never  Substance Use Topics   Alcohol use: No   Drug use: Never       OPHTHALMIC EXAM:  Base Eye Exam     Visual Acuity (Snellen - Linear)       Right Left   Dist cc 20/20 20/70 +1   Dist ph cc  NI    Correction: Glasses         Tonometry (Tonopen, 1:50 PM)       Right Left   Pressure 13 15         Pupils       Dark Light Shape React APD   Right 4 3 Round Brisk None   Left 4 3 Round Brisk None         Visual Fields       Left Right    Full Full         Extraocular Movement       Right Left    Full, Ortho Full, Ortho         Neuro/Psych     Oriented x3: Yes   Mood/Affect: Normal         Dilation     Both eyes: 1.0% Mydriacyl, 2.5% Phenylephrine @ 1:55 PM           Slit Lamp and Fundus Exam     External Exam       Right  Left   External Normal Normal         Slit Lamp Exam       Right Left   Lids/Lashes Normal Normal   Conjunctiva/Sclera White and quiet White and quiet   Cornea Clear Corneal haze/scar centrally, early band K nasally   Anterior Chamber Deep and clear Deep and clear   Iris Round and dilated, No NVI, +PPM Round and dilated, No NVI, +PPM   Lens Clear Clear   Anterior Vitreous mild syneresis Vitreous syneresis         Fundus Exam       Right Left   Disc Pink and sharp, mild PPP Pink and sharp, vascular loops   C/D Ratio 0.5 0.5   Macula Flat, Good foveal reflex, No heme or edema Flat, Good foveal reflex, No heme or edema   Vessels attenuated, Tortuous, no NV Attenuated, tortuous, mild peripheral fibriosis, no NV   Periphery Attached, mild peripheral fibrosis 360, no heme, pigmented CR scarring temporally, focal pigmented lattice and atrophic hole at 0500 and 1100, pigmented lattice without holes from 0900-1000 -- good laser changes surrounding all lesions, not fully mature Attached, dense pigmented CR scarring 360 greatest temporally, no heme,  mild peripheral fibrosis posterior to laser, No RT/RD           Refraction     Wearing Rx       Sphere Cylinder Axis   Right -6.00 +0.25 096   Left -8.25 +1.50 036            IMAGING AND PROCEDURES  Imaging and Procedures for 02/29/2024  OCT, Retina - OU - Both Eyes       Right Eye Quality was good. Central Foveal Thickness: 324. Progression has been stable. Findings include normal foveal contour, no IRF, no SRF, macular pucker, vitreomacular adhesion (Mild pucker).   Left Eye Quality was good. Central Foveal Thickness: 304. Progression has been stable. Findings include normal foveal contour, no IRF, no SRF, outer retinal atrophy, vitreomacular adhesion (Large patch ORA temporal periphery caught on widefield).   Notes *Images captured and stored on drive  Diagnosis / Impression:  NFP; no IRF/SRF OU OD: Mild pucker OS: Large patch of ORA temporal periphery caught on widefield  Clinical management:  See below  Abbreviations: NFP - Normal foveal profile. CME - cystoid macular edema. PED - pigment epithelial detachment. IRF - intraretinal fluid. SRF - subretinal fluid. EZ - ellipsoid zone. ERM - epiretinal membrane. ORA - outer retinal atrophy. ORT - outer retinal tubulation. SRHM - subretinal hyper-reflective material. 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 Retinal hole of right eye  H33.321 OCT, Retina - OU - Both Eyes    3. 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  Lattice degeneration w/ atrophic holes, right eye - focal pigmented lattice w/ holes at 0500 and 1100 oclock - pigmented lattice w/o holes from 0900-1000 oclock - s/p laser retinopexy OD (02.18.25) -- good laser changes in place but not yet fully mature -  completed PF QID OD x7 days - no new RT/RD - f/u 6-8 weeks -- DFE/OCT  3,4.  History of retinopathy of prematurity s/p laser  - unsure of gestational age or birth wt. - h/o laser as a baby, pt unsure of where treatment was performed - OCT stable OU -- OD: Mild pucker, OS: Large patch ORA caught on widefield - FA (05.06.24) shows OD: Vascular non-perfusion temporal periphery, no leakage or NV; OS: 360 staining and window defect from peripheral ROP laser, focal vascualar non-perfusion far nasal periphery, no NV - BCVA stable OU -- OD 20/20, OS 20/60 (pt with significant corneal scar OS) - exam shows extensive CR scarring OU; extensive laser OS, minimal OD; no significant hemorrhage or active NV OU - follow up 9 months, DFE, OCT  5. Corneal scarring  / haze OS  - under the expert management of Dr. Noel Gerold  Ophthalmic Meds Ordered this visit:  No orders of the defined types were placed in this encounter.    Return for f/u 6-8 weeks, lattice degeneration OD, DFE, OCT.  There are no Patient Instructions on file for this visit.   Explained the diagnoses, plan, and follow up with the patient and they expressed understanding.  Patient expressed understanding of the importance of proper follow up care.   This document serves as a record of services personally performed by Karie Chimera, MD, PhD. It was created on their behalf by Glee Arvin. Manson Passey, OA an ophthalmic technician. The creation of this record is the provider's dictation and/or activities during the visit.    Electronically signed by: Glee Arvin. Manson Passey, OA 02/29/24 9:33 PM  Karie Chimera, M.D., Ph.D. Diseases & Surgery of the Retina and Vitreous [facility]  I have reviewed the above documentation for accuracy and completeness, and I agree with the above. Karie Chimera, M.D., Ph.D. 02/29/24 9:35 PM   Abbreviations: M myopia (nearsighted); A astigmatism; H hyperopia (farsighted); P presbyopia; Mrx spectacle  prescription;  CTL contact lenses; OD right eye; OS left eye; OU both eyes  XT exotropia; ET esotropia; PEK punctate epithelial keratitis; PEE punctate epithelial erosions; DES dry eye syndrome; MGD meibomian gland dysfunction; ATs artificial tears; PFAT's preservative free artificial tears; NSC nuclear sclerotic cataract; PSC posterior subcapsular cataract; ERM epi-retinal membrane; PVD posterior vitreous detachment; RD retinal detachment; DM diabetes mellitus; DR diabetic retinopathy; NPDR non-proliferative diabetic retinopathy; PDR proliferative diabetic retinopathy; CSME clinically significant macular edema; DME diabetic macular edema; dbh dot blot hemorrhages; CWS cotton wool spot; POAG primary open angle glaucoma; C/D cup-to-disc ratio; HVF humphrey visual field; GVF goldmann visual field; OCT optical coherence tomography; IOP intraocular pressure; BRVO Branch retinal vein occlusion; CRVO central retinal vein occlusion; CRAO central retinal artery occlusion; BRAO branch retinal artery occlusion; RT retinal tear; SB scleral buckle; PPV pars plana vitrectomy; VH Vitreous hemorrhage; PRP panretinal laser photocoagulation; IVK intravitreal kenalog; VMT vitreomacular traction; MH Macular hole;  NVD neovascularization of the disc; NVE neovascularization elsewhere; AREDS age related eye disease study; ARMD age related macular degeneration; POAG primary open angle glaucoma; EBMD epithelial/anterior basement membrane dystrophy; ACIOL anterior chamber intraocular lens; IOL intraocular lens; PCIOL posterior chamber intraocular lens; Phaco/IOL phacoemulsification with intraocular lens placement; PRK photorefractive keratectomy; LASIK laser assisted in situ keratomileusis; HTN hypertension; DM diabetes mellitus; COPD chronic obstructive pulmonary disease

## 2024-02-29 ENCOUNTER — Ambulatory Visit (INDEPENDENT_AMBULATORY_CARE_PROVIDER_SITE_OTHER): Payer: 59 | Admitting: Ophthalmology

## 2024-02-29 ENCOUNTER — Encounter (INDEPENDENT_AMBULATORY_CARE_PROVIDER_SITE_OTHER): Payer: Self-pay | Admitting: Ophthalmology

## 2024-02-29 DIAGNOSIS — Z8669 Personal history of other diseases of the nervous system and sense organs: Secondary | ICD-10-CM

## 2024-02-29 DIAGNOSIS — H33321 Round hole, right eye: Secondary | ICD-10-CM

## 2024-02-29 DIAGNOSIS — H35411 Lattice degeneration of retina, right eye: Secondary | ICD-10-CM

## 2024-02-29 DIAGNOSIS — H179 Unspecified corneal scar and opacity: Secondary | ICD-10-CM

## 2024-02-29 DIAGNOSIS — H31003 Unspecified chorioretinal scars, bilateral: Secondary | ICD-10-CM | POA: Diagnosis not present

## 2024-04-06 NOTE — Progress Notes (Signed)
 [facility] - Clinic Note  04/11/2024     CHIEF COMPLAINT Patient presents for Retina Follow Up   HISTORY OF PRESENT ILLNESS: Steven Gross is a 33 y.o. male who presents to the clinic today for:   HPI     Retina Follow Up   Patient presents with  Other.  In right eye.  Severity is moderate.  Duration of 6 weeks.  Since onset it is stable.  I, the attending physician,  performed the HPI with the patient and updated documentation appropriately.        Comments   Pt here for 6 wk ret f/u for lattice degen OD. Pt states VA is stable, no changes. No new floaters or FOL.       Last edited by Ronelle Coffee, MD on 04/11/2024 10:40 PM.    Pt states the vision is doing the same since his last visit.    Referring physician: No referring provider defined for this encounter.  HISTORICAL INFORMATION:   Selected notes from the MEDICAL RECORD NUMBER Referred by Dr. Faylene Hoots for retina eval -- h/o ROP s/p laser  LEE:  Ocular Hx- PMH-    CURRENT MEDICATIONS: No current outpatient medications on file. (Ophthalmic Drugs)   No current facility-administered medications for this visit. (Ophthalmic Drugs)   No current outpatient medications on file. (Other)   No current facility-administered medications for this visit. (Other)   REVIEW OF SYSTEMS: ROS   Positive for: Eyes Negative for: Constitutional, Gastrointestinal, Neurological, Skin, Genitourinary, Musculoskeletal, HENT, Endocrine, Cardiovascular, Respiratory, Psychiatric, Allergic/Imm, Heme/Lymph Last edited by Anthony Bateman, COT on 04/11/2024  1:27 PM.      ALLERGIES No Known Allergies  PAST MEDICAL HISTORY History reviewed. No pertinent past medical history. Past Surgical History:  Procedure Laterality Date   RETINAL DETACHMENT SURGERY Left    FAMILY HISTORY Family History  Problem Relation Age of Onset   Healthy Mother    Healthy Father    SOCIAL HISTORY Social History   Tobacco Use    Smoking status: Never    Passive exposure: Never   Smokeless tobacco: Never  Substance Use Topics   Alcohol use: No   Drug use: Never       OPHTHALMIC EXAM:  Base Eye Exam     Visual Acuity (Snellen - Linear)       Right Left   Dist cc 20/20 20/70 +2   Dist ph cc  20/60 -2    Correction: Glasses         Tonometry (Tonopen, 1:34 PM)       Right Left   Pressure 18 15         Pupils       Pupils Dark Light Shape React APD   Right PERRL 4 3 Round Brisk None   Left PERRL 4 3 Round Brisk None         Visual Fields (Counting fingers)       Left Right    Full Full         Extraocular Movement       Right Left    Full, Ortho Full, Ortho         Neuro/Psych     Oriented x3: Yes   Mood/Affect: Normal         Dilation     Both eyes: 1.0% Mydriacyl, 2.5% Phenylephrine @ 1:35 PM           Slit Lamp and Fundus Exam  External Exam       Right Left   External Normal Normal         Slit Lamp Exam       Right Left   Lids/Lashes Normal Normal   Conjunctiva/Sclera White and quiet White and quiet   Cornea Clear Corneal haze/scar centrally, early band K nasally   Anterior Chamber Deep and clear Deep and clear   Iris Round and dilated, No NVI, +PPM Round and dilated, No NVI, +PPM   Lens Clear Clear   Anterior Vitreous mild syneresis, mild Vitreous condensations Vitreous syneresis         Fundus Exam       Right Left   Disc Pink and sharp, mild PPP Pink and sharp, vascular loops   C/D Ratio 0.5 0.5   Macula Flat, Good foveal reflex, No heme or edema Flat, Good foveal reflex, No heme or edema   Vessels attenuated, Tortuous, no NV Attenuated, tortuous, mild peripheral fibriosis, no NV   Periphery Attached, mild peripheral fibrosis 360, no heme, pigmented CR scarring temporally, focal pigmented lattice and atrophic hole at 0500 and 1100, pigmented lattice without holes from 0900-1000 -- good laser changes surrounding all lesions, room  for fill in 1000-1100 Attached, dense pigmented CR scarring 360 greatest temporally, no heme, mild peripheral fibrosis posterior to laser, No RT/RD           Refraction     Wearing Rx       Sphere Cylinder Axis   Right -6.00 +0.25 096   Left -8.25 +1.50 036            IMAGING AND PROCEDURES  Imaging and Procedures for 04/11/2024  OCT, Retina - OU - Both Eyes       Right Eye Quality was good. Central Foveal Thickness: 319. Progression has been stable. Findings include normal foveal contour, no IRF, no SRF, macular pucker, vitreomacular adhesion (Mild pucker).   Left Eye Quality was good. Central Foveal Thickness: 303. Progression has been stable. Findings include normal foveal contour, no IRF, no SRF, outer retinal atrophy, vitreomacular adhesion (Large patch ORA temporal periphery caught on widefield).   Notes *Images captured and stored on drive  Diagnosis / Impression:  NFP; no IRF/SRF OU OD: Mild pucker OS: Large patch of ORA temporal periphery caught on widefield  Clinical management:  See below  Abbreviations: NFP - Normal foveal profile. CME - cystoid macular edema. PED - pigment epithelial detachment. IRF - intraretinal fluid. SRF - subretinal fluid. EZ - ellipsoid zone. ERM - epiretinal membrane. ORA - outer retinal atrophy. ORT - outer retinal tubulation. SRHM - subretinal hyper-reflective material. 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 Retinal hole of right eye  H33.321     3. 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  Lattice degeneration w/ atrophic holes, right eye - focal pigmented lattice w/ holes at 0500 and 1100 oclock - pigmented lattice w/o holes from 0900-1000 oclock - s/p laser retinopexy OD  (02.18.25) -- good laser changes in place, but room for fill in from 10-11 oclock - recommend fill in laser OD from 10-11 oclock -  will need to schedule fill in laser OD once laser is back up and running - no new RT/RD - will call to schedule visit for possible fill in laser OD -- DFE/OCT  3,4. History of retinopathy of prematurity s/p laser  - unsure of gestational age or birth wt. - h/o laser as a baby, pt unsure of where treatment was performed - OCT stable OU -- OD: Mild pucker, OS: Large patch ORA caught on widefield - FA (05.06.24) shows OD: Vascular non-perfusion temporal periphery, no leakage or NV; OS: 360 staining and window defect from peripheral ROP laser, focal vascualar non-perfusion far nasal periphery, no NV - BCVA stable OU -- OD 20/20, OS 20/60 (pt with significant corneal scar OS) - exam shows extensive CR scarring OU; extensive laser OS, minimal OD; no significant hemorrhage or active NV OU - follow up 9 months, DFE, OCT  5. Corneal scarring Delana Favors OS  - under the expert management of Dr. Faylene Hoots  Ophthalmic Meds Ordered this visit:  No orders of the defined types were placed in this encounter.    Return in about 3 weeks (around 05/02/2024) for f/u Lattice/hole OD , DFE, OCT, Possible, PRP, OD.  There are no Patient Instructions on file for this visit.   Explained the diagnoses, plan, and follow up with the patient and they expressed understanding.  Patient expressed understanding of the importance of proper follow up care.   This document serves as a record of services personally performed by Jeanice Millard, MD, PhD. It was created on their behalf by Morley Arabia. Bevin Bucks, OA an ophthalmic technician. The creation of this record is the provider's dictation and/or activities during the visit.    Electronically signed by: Morley Arabia. Bevin Bucks, OA 04/11/24 10:40 PM  This document serves as a record of services personally performed by Jeanice Millard, MD, PhD. It was created on  their behalf by Olene Berne, COT an ophthalmic technician. The creation of this record is the provider's dictation and/or activities during the visit.    Electronically signed by:  Olene Berne, COT  04/11/24 10:40 PM  Jeanice Millard, M.D., Ph.D. Diseases & Surgery of the Retina and Vitreous [facility]  I have reviewed the above documentation for accuracy and completeness, and I agree with the above. Jeanice Millard, M.D., Ph.D. 04/11/24 10:42 PM   Abbreviations: M myopia (nearsighted); A astigmatism; H hyperopia (farsighted); P presbyopia; Mrx spectacle prescription;  CTL contact lenses; OD right eye; OS left eye; OU both eyes  XT exotropia; ET esotropia; PEK punctate epithelial keratitis; PEE punctate epithelial erosions; DES dry eye syndrome; MGD meibomian gland dysfunction; ATs artificial tears; PFAT's preservative free artificial tears; NSC nuclear sclerotic cataract; PSC posterior subcapsular cataract; ERM epi-retinal membrane; PVD posterior vitreous detachment; RD retinal detachment; DM diabetes mellitus; DR diabetic retinopathy; NPDR non-proliferative diabetic retinopathy; PDR proliferative diabetic retinopathy; CSME clinically significant macular edema; DME diabetic macular edema; dbh dot blot hemorrhages; CWS cotton wool spot; POAG primary open angle glaucoma; C/D cup-to-disc ratio; HVF humphrey visual field; GVF goldmann visual field; OCT optical coherence tomography; IOP intraocular pressure; BRVO Branch retinal vein occlusion; CRVO central retinal vein occlusion; CRAO central retinal artery occlusion; BRAO branch retinal artery occlusion; RT retinal tear; SB scleral buckle; PPV pars plana vitrectomy; VH Vitreous hemorrhage; PRP panretinal laser photocoagulation; IVK intravitreal kenalog; VMT vitreomacular traction; MH Macular hole;  NVD neovascularization of the disc; NVE neovascularization elsewhere; AREDS age related eye disease study; ARMD  age  related macular degeneration; POAG primary open angle glaucoma; EBMD epithelial/anterior basement membrane dystrophy; ACIOL anterior chamber intraocular lens; IOL intraocular lens; PCIOL posterior chamber intraocular lens; Phaco/IOL phacoemulsification with intraocular lens placement; PRK photorefractive keratectomy; LASIK laser assisted in situ keratomileusis; HTN hypertension; DM diabetes mellitus; COPD chronic obstructive pulmonary disease

## 2024-04-11 ENCOUNTER — Ambulatory Visit (INDEPENDENT_AMBULATORY_CARE_PROVIDER_SITE_OTHER): Admitting: Ophthalmology

## 2024-04-11 ENCOUNTER — Encounter (INDEPENDENT_AMBULATORY_CARE_PROVIDER_SITE_OTHER): Payer: Self-pay | Admitting: Ophthalmology

## 2024-04-11 DIAGNOSIS — H179 Unspecified corneal scar and opacity: Secondary | ICD-10-CM

## 2024-04-11 DIAGNOSIS — H35411 Lattice degeneration of retina, right eye: Secondary | ICD-10-CM

## 2024-04-11 DIAGNOSIS — Z8669 Personal history of other diseases of the nervous system and sense organs: Secondary | ICD-10-CM

## 2024-04-11 DIAGNOSIS — H33321 Round hole, right eye: Secondary | ICD-10-CM

## 2024-04-11 DIAGNOSIS — H31003 Unspecified chorioretinal scars, bilateral: Secondary | ICD-10-CM

## 2024-04-20 NOTE — Progress Notes (Signed)
 [facility] - Clinic Note  05/03/2024     CHIEF COMPLAINT Patient presents for Retina Follow Up   HISTORY OF PRESENT ILLNESS: Steven Gross is a 33 y.o. male who presents to the clinic today for:   HPI     Retina Follow Up   Patient presents with  Other.  In right eye.  This started 3 weeks ago.  I, the attending physician,  performed the HPI with the patient and updated documentation appropriately.        Comments   Patient here for 3 weeks retina follow up for lattice/Hole OD. Patient states vision doing fine. No changes or difficulties.       Last edited by Ronelle Coffee, MD on 05/03/2024  4:51 PM.       Referring physician: No referring provider defined for this encounter.  HISTORICAL INFORMATION:   Selected notes from the MEDICAL RECORD NUMBER Referred by Dr. Faylene Hoots for retina eval -- h/o ROP s/p laser  LEE:  Ocular Hx- PMH-    CURRENT MEDICATIONS: No current outpatient medications on file. (Ophthalmic Drugs)   No current facility-administered medications for this visit. (Ophthalmic Drugs)   No current outpatient medications on file. (Other)   No current facility-administered medications for this visit. (Other)   REVIEW OF SYSTEMS: ROS   Positive for: Eyes Negative for: Constitutional, Gastrointestinal, Neurological, Skin, Genitourinary, Musculoskeletal, HENT, Endocrine, Cardiovascular, Respiratory, Psychiatric, Allergic/Imm, Heme/Lymph Last edited by Sylvan Evener, COA on 05/03/2024  2:32 PM.       ALLERGIES No Known Allergies  PAST MEDICAL HISTORY History reviewed. No pertinent past medical history. Past Surgical History:  Procedure Laterality Date   RETINAL DETACHMENT SURGERY Left    FAMILY HISTORY Family History  Problem Relation Age of Onset   Healthy Mother    Healthy Father    SOCIAL HISTORY Social History   Tobacco Use   Smoking status: Never    Passive exposure: Never   Smokeless tobacco: Never   Substance Use Topics   Alcohol use: No   Drug use: Never       OPHTHALMIC EXAM:  Base Eye Exam     Visual Acuity (Snellen - Linear)       Right Left   Dist cc 20/20 20/60 -1   Dist ph cc  20/50 -2    Correction: Glasses         Tonometry (Tonopen, 2:30 PM)       Right Left   Pressure 15 19         Pupils       Dark Light Shape React APD   Right 4 3 Round Brisk None   Left 4 3 Round Brisk None         Visual Fields (Counting fingers)       Left Right    Full Full         Extraocular Movement       Right Left    Full, Ortho Full, Ortho         Neuro/Psych     Oriented x3: Yes   Mood/Affect: Normal         Dilation     Both eyes: 1.0% Mydriacyl, 2.5% Phenylephrine @ 2:29 PM           Slit Lamp and Fundus Exam     External Exam       Right Left   External Normal Normal  Slit Lamp Exam       Right Left   Lids/Lashes Normal Normal   Conjunctiva/Sclera White and quiet White and quiet   Cornea Clear Corneal haze/scar centrally, early band K nasally   Anterior Chamber Deep and clear Deep and clear   Iris Round and dilated, No NVI, +PPM Round and dilated, No NVI, +PPM   Lens Clear Clear   Anterior Vitreous mild syneresis, mild Vitreous condensations Vitreous syneresis         Fundus Exam       Right Left   Disc Pink and sharp, mild PPP Pink and sharp, vascular loops   C/D Ratio 0.5 0.5   Macula Flat, Good foveal reflex, No heme or edema Flat, Good foveal reflex, No heme or edema   Vessels attenuated, Tortuous, no NV Attenuated, tortuous, mild peripheral fibriosis, no NV   Periphery Attached, mild peripheral fibrosis 360, no heme, pigmented CR scarring temporally, focal pigmented lattice and atrophic hole at 0500 and 1100, pigmented lattice without holes from 0900-1000 -- good laser changes surrounding all lesions, room for fill in 1000-1100 Attached, dense pigmented CR scarring 360 greatest temporally, no heme, mild  peripheral fibrosis posterior to laser, No RT/RD           Refraction     Wearing Rx       Sphere Cylinder Axis   Right -6.00 +0.25 096   Left -8.25 +1.50 036            IMAGING AND PROCEDURES  Imaging and Procedures for 05/03/2024  OCT, Retina - OU - Both Eyes        Right Eye Quality was good. Central Foveal Thickness: 315. Progression has been stable. Findings include normal foveal contour, no IRF, no SRF, macular pucker, vitreomacular adhesion (Mild pucker).   Left Eye Quality was good. Central Foveal Thickness: 304. Progression has been stable. Findings include normal foveal contour, no IRF, no SRF, outer retinal atrophy, vitreomacular adhesion (Large patch ORA temporal periphery caught on widefield).   Notes  *Images captured and stored on drive  Diagnosis / Impression:  NFP; no IRF/SRF OU OD: Mild pucker OS: Large patch of ORA temporal periphery caught on widefield  Clinical management:  See below  Abbreviations: NFP - Normal foveal profile. CME - cystoid macular edema. PED - pigment epithelial detachment. IRF - intraretinal fluid. SRF - subretinal fluid. EZ - ellipsoid zone. ERM - epiretinal membrane. ORA - outer retinal atrophy. ORT - outer retinal tubulation. SRHM - subretinal hyper-reflective material. IRHM - intraretinal hyper-reflective material      LASER PROCEDURE NOTE   Procedure:     Supplemental barrier laser retinopexy using slit lamp laser, RIGHT eye    Diagnosis:       Lattice degeneration w/ atrophic holes, RIGHT eye                       Patches of lattice: 1100 superiorly; 0500 inferiorly   Surgeon:        Ronelle Coffee, MD, PhD   Anesthesia:    Topical   Informed consent obtained, operative eye marked, and time out performed prior to initiation of laser.    Laser settings:  Lumenis Smart532 laser, slit lamp Lens: Mainster PRP 165 Power: 270 mW Spot size: 300 microns Duration: 30 msec  # spots: 135   Placement of laser:  Using a Mainster PRP 165 contact lens at the slit lamp, fill-in laser was placed in three confluent rows  posterior to patches of lattice w/ atrophic holes from 10-11 oclock.   Complications: None.   Patient tolerated the procedure well and received written and verbal post-procedure care information/education.       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 Retinal hole of right eye  H33.321 OCT, Retina - OU - Both Eyes    3. 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  Lattice degeneration w/ atrophic holes, right eye - focal pigmented lattice w/ holes at 0500 and 1100 oclock - pigmented lattice w/o holes from 0900-1000 oclock - s/p laser retinopexy OD (02.18.25) -- good laser changes in place, but room for fill in from 10-11 oclock - recommend fill in laser OD from 10-11 oclock today, 05.13.25 - pt wishes to proceed with laser - RBA of procedure discussed, questions answered - informed consent obtained and signed - see procedure note - start PF QID OD x7 days - no new RT/RD - f/u 3 weeks, DFE, OCT  3,4. History of retinopathy of prematurity s/p laser  - unsure of gestational age or birth wt. - h/o laser as a baby, pt unsure of where treatment was performed - OCT stable OU -- OD: Mild pucker, OS: Large patch ORA caught on widefield - FA (05.06.24) shows OD: Vascular non-perfusion temporal periphery, no leakage or NV; OS: 360 staining and window defect from peripheral ROP laser, focal vascualar non-perfusion far nasal periphery, no NV - BCVA stable OU -- OD 20/20, OS 20/60 (pt with significant corneal scar OS) - exam shows extensive CR scarring OU; extensive laser OS, minimal OD; no significant hemorrhage or active NV OU - follow up 9 months, DFE, OCT  5. Corneal scarring Delana Favors OS  - under the expert  management of Dr. Faylene Hoots  Ophthalmic Meds Ordered this visit:  No orders of the defined types were placed in this encounter.    Return in about 3 weeks (around 05/24/2024) for f/u lattice degeneration OD, DFE, OCT.  There are no Patient Instructions on file for this visit.  Explained the diagnoses, plan, and follow up with the patient and they expressed understanding.  Patient expressed understanding of the importance of proper follow up care.   This document serves as a record of services personally performed by Jeanice Millard, MD, PhD. It was created on their behalf by Olene Berne, COT an ophthalmic technician. The creation of this record is the provider's dictation and/or activities during the visit.    Electronically signed by:  Olene Berne, COT  05/03/24 4:52 PM  Jeanice Millard, M.D., Ph.D. Diseases & Surgery of the Retina and Vitreous [facility]  I have reviewed the above documentation for accuracy and completeness, and I agree with the above. Jeanice Millard, M.D., Ph.D. 05/03/24 4:55 PM   Abbreviations: M myopia (nearsighted); A astigmatism; H hyperopia (farsighted); P presbyopia; Mrx spectacle prescription;  CTL contact lenses; OD right eye; OS left eye; OU both eyes  XT exotropia; ET esotropia; PEK punctate epithelial keratitis; PEE punctate epithelial erosions; DES dry eye syndrome; MGD meibomian gland dysfunction; ATs artificial tears; PFAT's preservative free artificial tears; NSC nuclear sclerotic cataract; PSC posterior subcapsular cataract; ERM epi-retinal membrane; PVD posterior  vitreous detachment; RD retinal detachment; DM diabetes mellitus; DR diabetic retinopathy; NPDR non-proliferative diabetic retinopathy; PDR proliferative diabetic retinopathy; CSME clinically significant macular edema; DME diabetic macular edema; dbh dot blot hemorrhages; CWS cotton wool spot; POAG primary open angle glaucoma; C/D cup-to-disc ratio; HVF humphrey  visual field; GVF goldmann visual field; OCT optical coherence tomography; IOP intraocular pressure; BRVO Branch retinal vein occlusion; CRVO central retinal vein occlusion; CRAO central retinal artery occlusion; BRAO branch retinal artery occlusion; RT retinal tear; SB scleral buckle; PPV pars plana vitrectomy; VH Vitreous hemorrhage; PRP panretinal laser photocoagulation; IVK intravitreal kenalog; VMT vitreomacular traction; MH Macular hole;  NVD neovascularization of the disc; NVE neovascularization elsewhere; AREDS age related eye disease study; ARMD age related macular degeneration; POAG primary open angle glaucoma; EBMD epithelial/anterior basement membrane dystrophy; ACIOL anterior chamber intraocular lens; IOL intraocular lens; PCIOL posterior chamber intraocular lens; Phaco/IOL phacoemulsification with intraocular lens placement; PRK photorefractive keratectomy; LASIK laser assisted in situ keratomileusis; HTN hypertension; DM diabetes mellitus; COPD chronic obstructive pulmonary disease

## 2024-05-03 ENCOUNTER — Ambulatory Visit (INDEPENDENT_AMBULATORY_CARE_PROVIDER_SITE_OTHER): Admitting: Ophthalmology

## 2024-05-03 ENCOUNTER — Encounter (INDEPENDENT_AMBULATORY_CARE_PROVIDER_SITE_OTHER): Payer: Self-pay | Admitting: Ophthalmology

## 2024-05-03 DIAGNOSIS — H35411 Lattice degeneration of retina, right eye: Secondary | ICD-10-CM | POA: Diagnosis not present

## 2024-05-03 DIAGNOSIS — Z8669 Personal history of other diseases of the nervous system and sense organs: Secondary | ICD-10-CM | POA: Diagnosis not present

## 2024-05-03 DIAGNOSIS — H33321 Round hole, right eye: Secondary | ICD-10-CM

## 2024-05-03 DIAGNOSIS — H31003 Unspecified chorioretinal scars, bilateral: Secondary | ICD-10-CM

## 2024-05-03 DIAGNOSIS — H179 Unspecified corneal scar and opacity: Secondary | ICD-10-CM

## 2024-05-12 NOTE — Progress Notes (Signed)
 [facility] - Clinic Note  05/23/2024     CHIEF COMPLAINT Patient presents for Retina Follow Up   HISTORY OF PRESENT ILLNESS: Steven Gross is a 33 y.o. male who presents to the clinic today for:   HPI     Retina Follow Up   In right eye.  This started 3 weeks ago.  Duration of 3 weeks.  Since onset it is stable.  I, the attending physician,  performed the HPI with the patient and updated documentation appropriately.        Comments   3 week retina follow up lattice PRP OD pt is reporting no vision changes noticed he denies any flashes or floaters       Last edited by Ronelle Coffee, MD on 05/24/2024 11:22 PM.     Pt states he had no problems after the last laser procedure   Referring physician: No referring provider defined for this encounter.  HISTORICAL INFORMATION:   Selected notes from the MEDICAL RECORD NUMBER Referred by Dr. Faylene Hoots for retina eval -- h/o ROP s/p laser  LEE:  Ocular Hx- PMH-    CURRENT MEDICATIONS: No current outpatient medications on file. (Ophthalmic Drugs)   No current facility-administered medications for this visit. (Ophthalmic Drugs)   No current outpatient medications on file. (Other)   No current facility-administered medications for this visit. (Other)   REVIEW OF SYSTEMS: ROS   Positive for: Eyes Negative for: Constitutional, Gastrointestinal, Neurological, Skin, Genitourinary, Musculoskeletal, HENT, Endocrine, Cardiovascular, Respiratory, Psychiatric, Allergic/Imm, Heme/Lymph Last edited by Alise Appl, COT on 05/23/2024  2:05 PM.     ALLERGIES No Known Allergies  PAST MEDICAL HISTORY History reviewed. No pertinent past medical history. Past Surgical History:  Procedure Laterality Date   RETINAL DETACHMENT SURGERY Left    FAMILY HISTORY Family History  Problem Relation Age of Onset   Healthy Mother    Healthy Father    SOCIAL HISTORY Social History   Tobacco Use   Smoking status:  Never    Passive exposure: Never   Smokeless tobacco: Never  Substance Use Topics   Alcohol use: No   Drug use: Never       OPHTHALMIC EXAM:  Base Eye Exam     Visual Acuity (Snellen - Linear)       Right Left   Dist cc 20/20 -1 20/60 -2   Dist ph cc  20/50 -2    Correction: Glasses         Tonometry (Tonopen, 2:13 PM)       Right Left   Pressure 16 17         Pupils       Pupils Dark Light Shape React APD   Right PERRL 4 3 Round Brisk None   Left PERRL 4 3 Round Brisk None         Visual Fields       Left Right    Full Full         Extraocular Movement       Right Left    Full, Ortho Full, Ortho         Neuro/Psych     Oriented x3: Yes   Mood/Affect: Normal         Dilation     Both eyes: 2.5% Phenylephrine @ 2:13 PM           Slit Lamp and Fundus Exam     External Exam  Right Left   External Normal Normal         Slit Lamp Exam       Right Left   Lids/Lashes Normal Normal   Conjunctiva/Sclera White and quiet White and quiet   Cornea Clear Corneal haze/scar centrally, early band K nasally   Anterior Chamber Deep and clear Deep and clear   Iris Round and dilated, No NVI, +PPM Round and dilated, No NVI, +PPM   Lens Clear Clear   Anterior Vitreous mild syneresis, mild Vitreous condensations Vitreous syneresis         Fundus Exam       Right Left   Disc Pink and sharp, mild PPP Pink and sharp, vascular loops   C/D Ratio 0.5 0.5   Macula Flat, Good foveal reflex, No heme or edema Flat, Good foveal reflex, No heme or edema   Vessels attenuated, Tortuous, no NV Attenuated, tortuous, mild peripheral fibriosis, no NV   Periphery Attached, mild peripheral fibrosis 360, no heme, pigmented CR scarring temporally, focal pigmented lattice and atrophic hole at 0500 and 1100, pigmented lattice without holes from 0900-1000 -- good laser changes surrounding all lesionsand now with good fill in from 1000-1100; WWoP and  avascular ridge IT periphery Attached, dense pigmented CR scarring 360 greatest temporally, no heme, mild peripheral fibrosis posterior to laser, No RT/RD           Refraction     Wearing Rx       Sphere Cylinder Axis   Right -6.00 +0.25 096   Left -8.25 +1.50 036            IMAGING AND PROCEDURES  Imaging and Procedures for 05/23/2024  OCT, Retina - OU - Both Eyes        Right Eye Quality was good. Central Foveal Thickness: 318. Progression has been stable. Findings include normal foveal contour, no IRF, no SRF, macular pucker, vitreomacular adhesion (Mild pucker).   Left Eye Quality was good. Central Foveal Thickness: 305. Progression has been stable. Findings include normal foveal contour, no IRF, no SRF, outer retinal atrophy, vitreomacular adhesion (Large patch ORA temporal periphery caught on widefield).   Notes  *Images captured and stored on drive  Diagnosis / Impression:  NFP; no IRF/SRF OU OD: Mild pucker OS: Large patch of ORA temporal periphery caught on widefield  Clinical management:  See below  Abbreviations: NFP - Normal foveal profile. CME - cystoid macular edema. PED - pigment epithelial detachment. IRF - intraretinal fluid. SRF - subretinal fluid. EZ - ellipsoid zone. ERM - epiretinal membrane. ORA - outer retinal atrophy. ORT - outer retinal tubulation. SRHM - subretinal hyper-reflective material. 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 Retinal hole of right eye  H33.321     3. 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  Lattice degeneration w/ atrophic holes, right eye - focal pigmented lattice w/ holes at 0500 and 1100 oclock - pigmented lattice  w/o holes from 0900-1000 oclock - s/p laser retinopexy OD (02.18.25) -- good laser changes in place - s/p fill-in  laser retinopexy from 10-11 oclock (05.13.25) -- good laser changes in place - completed PF QID OD x7 days - no new RT/RD - f/u 6 months, DFE, OCT  3,4. History of retinopathy of prematurity s/p laser  - unsure of gestational age or birth wt. - h/o laser as a baby, pt unsure of where treatment was performed - OCT stable OU -- OD: Mild pucker, OS: Large patch ORA caught on widefield - FA (05.06.24) shows OD: Vascular non-perfusion temporal periphery, no leakage or NV; OS: 360 staining and window defect from peripheral ROP laser, focal vascualar non-perfusion far nasal periphery, no NV - BCVA stable OU -- OD 20/20, OS 20/50 (pt with significant corneal scar OS) - exam shows extensive CR scarring OU; extensive laser OS, minimal OD; no significant hemorrhage or active NV OU - follow up 6 months, DFE, OCT  5. Corneal scarring Delana Favors OS  - under the expert management of Dr. Faylene Hoots  Ophthalmic Meds Ordered this visit:  No orders of the defined types were placed in this encounter.    Return in about 6 months (around 11/22/2024) for f/u lattice degeneration OD, DFE, OCT.  There are no Patient Instructions on file for this visit.  Explained the diagnoses, plan, and follow up with the patient and they expressed understanding.  Patient expressed understanding of the importance of proper follow up care.   This document serves as a record of services personally performed by Jeanice Millard, MD, PhD. It was created on their behalf by Mayola Specking, COA an ophthalmic technician. The creation of this record is the provider's dictation and/or activities during the visit.   Electronically signed by: Carrington Clack, COT  05/24/24  11:27 PM   This document serves as a record of services personally performed by Jeanice Millard, MD, PhD. It was created on their behalf by Morley Arabia. Bevin Bucks, OA an  ophthalmic technician. The creation of this record is the provider's dictation and/or activities during the visit.    Electronically signed by: Morley Arabia. Bevin Bucks, OA 05/24/24 11:27 PM  Jeanice Millard, M.D., Ph.D. Diseases & Surgery of the Retina and Vitreous [facility]  I have reviewed the above documentation for accuracy and completeness, and I agree with the above. Jeanice Millard, M.D., Ph.D. 05/24/24 11:28 PM   Abbreviations: M myopia (nearsighted); A astigmatism; H hyperopia (farsighted); P presbyopia; Mrx spectacle prescription;  CTL contact lenses; OD right eye; OS left eye; OU both eyes  XT exotropia; ET esotropia; PEK punctate epithelial keratitis; PEE punctate epithelial erosions; DES dry eye syndrome; MGD meibomian gland dysfunction; ATs artificial tears; PFAT's preservative free artificial tears; NSC nuclear sclerotic cataract; PSC posterior subcapsular cataract; ERM epi-retinal membrane; PVD posterior vitreous detachment; RD retinal detachment; DM diabetes mellitus; DR diabetic retinopathy; NPDR non-proliferative diabetic retinopathy; PDR proliferative diabetic retinopathy; CSME clinically significant macular edema; DME diabetic macular edema; dbh dot blot hemorrhages; CWS cotton wool spot; POAG primary open angle glaucoma; C/D cup-to-disc ratio; HVF humphrey visual field; GVF goldmann visual field; OCT optical coherence tomography; IOP intraocular pressure; BRVO Branch retinal vein occlusion; CRVO central retinal vein occlusion; CRAO central retinal artery occlusion; BRAO branch retinal artery occlusion; RT retinal tear; SB scleral buckle; PPV pars plana vitrectomy; VH Vitreous hemorrhage; PRP panretinal laser photocoagulation; IVK intravitreal kenalog; VMT vitreomacular traction; MH Macular hole;  NVD neovascularization of the disc; NVE neovascularization elsewhere; AREDS age related eye disease study; ARMD age related macular degeneration; POAG primary open angle  glaucoma; EBMD epithelial/anterior basement membrane dystrophy; ACIOL anterior chamber intraocular lens; IOL intraocular lens; PCIOL posterior chamber intraocular lens; Phaco/IOL phacoemulsification with intraocular lens placement; PRK photorefractive keratectomy; LASIK laser assisted in situ keratomileusis; HTN hypertension; DM diabetes mellitus; COPD chronic obstructive pulmonary disease

## 2024-05-23 ENCOUNTER — Encounter (INDEPENDENT_AMBULATORY_CARE_PROVIDER_SITE_OTHER): Payer: Self-pay | Admitting: Ophthalmology

## 2024-05-23 ENCOUNTER — Ambulatory Visit (INDEPENDENT_AMBULATORY_CARE_PROVIDER_SITE_OTHER): Admitting: Ophthalmology

## 2024-05-23 DIAGNOSIS — H33321 Round hole, right eye: Secondary | ICD-10-CM

## 2024-05-23 DIAGNOSIS — H35411 Lattice degeneration of retina, right eye: Secondary | ICD-10-CM

## 2024-05-23 DIAGNOSIS — Z8669 Personal history of other diseases of the nervous system and sense organs: Secondary | ICD-10-CM

## 2024-05-23 DIAGNOSIS — H31003 Unspecified chorioretinal scars, bilateral: Secondary | ICD-10-CM

## 2024-05-23 DIAGNOSIS — H179 Unspecified corneal scar and opacity: Secondary | ICD-10-CM

## 2024-05-24 ENCOUNTER — Encounter (INDEPENDENT_AMBULATORY_CARE_PROVIDER_SITE_OTHER): Payer: Self-pay | Admitting: Ophthalmology

## 2024-10-15 ENCOUNTER — Emergency Department (HOSPITAL_BASED_OUTPATIENT_CLINIC_OR_DEPARTMENT_OTHER)

## 2024-10-15 ENCOUNTER — Other Ambulatory Visit: Payer: Self-pay

## 2024-10-15 ENCOUNTER — Encounter (HOSPITAL_BASED_OUTPATIENT_CLINIC_OR_DEPARTMENT_OTHER): Payer: Self-pay

## 2024-10-15 ENCOUNTER — Emergency Department (HOSPITAL_BASED_OUTPATIENT_CLINIC_OR_DEPARTMENT_OTHER): Admission: EM | Admit: 2024-10-15 | Discharge: 2024-10-15 | Disposition: A | Discharge status: Home / Self Care

## 2024-10-15 DIAGNOSIS — R0789 Other chest pain: Secondary | ICD-10-CM | POA: Diagnosis present

## 2024-10-15 DIAGNOSIS — E860 Dehydration: Secondary | ICD-10-CM | POA: Diagnosis not present

## 2024-10-15 DIAGNOSIS — M62838 Other muscle spasm: Secondary | ICD-10-CM | POA: Insufficient documentation

## 2024-10-15 LAB — CBC
HCT: 44.5 % (ref 39.0–52.0)
Hemoglobin: 15.8 g/dL (ref 13.0–17.0)
MCH: 31.2 pg (ref 26.0–34.0)
MCHC: 35.5 g/dL (ref 30.0–36.0)
MCV: 87.8 fL (ref 80.0–100.0)
Platelets: 272 K/uL (ref 150–400)
RBC: 5.07 MIL/uL (ref 4.22–5.81)
RDW: 11.5 % (ref 11.5–15.5)
WBC: 7.4 K/uL (ref 4.0–10.5)
nRBC: 0 % (ref 0.0–0.2)

## 2024-10-15 LAB — BASIC METABOLIC PANEL WITH GFR
Anion gap: 16 — ABNORMAL HIGH (ref 5–15)
BUN: 16 mg/dL (ref 6–20)
CO2: 21 mmol/L — ABNORMAL LOW (ref 22–32)
Calcium: 9.8 mg/dL (ref 8.9–10.3)
Chloride: 105 mmol/L (ref 98–111)
Creatinine, Ser: 1.13 mg/dL (ref 0.61–1.24)
GFR, Estimated: >60 mL/min (ref >60)
Glucose, Bld: 126 mg/dL — ABNORMAL HIGH (ref 70–99)
Potassium: 3.4 mmol/L — ABNORMAL LOW (ref 3.5–5.1)
Sodium: 142 mmol/L (ref 135–145)

## 2024-10-15 LAB — TROPONIN T, HIGH SENSITIVITY: Troponin T High Sensitivity: <15 ng/L (ref 0–19)

## 2024-10-15 MED ORDER — POTASSIUM CHLORIDE CRYS ER 20 MEQ PO TBCR
40.0000 meq | EXTENDED_RELEASE_TABLET | Freq: Once | ORAL | Status: AC
Start: 2024-10-15 — End: 2024-10-15
  Administered 2024-10-15: 40 meq via ORAL
  Filled 2024-10-15: qty 2

## 2024-10-15 MED ORDER — METHOCARBAMOL 500 MG PO TABS: 500.0000 mg | ORAL_TABLET | Freq: Three times a day (TID) | ORAL | 0 refills | Status: AC | PRN

## 2024-10-15 MED ORDER — DIAZEPAM 5 MG PO TABS
5.0000 mg | ORAL_TABLET | Freq: Once | ORAL | Status: AC
  Administered 2024-10-15: 5 mg via ORAL
  Filled 2024-10-15: qty 1

## 2024-10-15 NOTE — Discharge Instructions (Addendum)
 Your workup today was reassuring.  Please drink plenty of fluids prior to exerting yourself in the future and take the Robaxin as needed for any muscle spasms.  Do not drive or drink alcohol taking this as may make you drowsy.  Follow-up with your doctor.  Return to the ER for worsening symptoms.

## 2024-10-15 NOTE — ED Notes (Signed)
 Pt alert and oriented X 4 at the time of discharge. RR even and unlabored. No acute distress noted. Pt verbalized understanding of discharge instructions as discussed. Pt ambulatory to lobby at time of discharge.

## 2024-10-15 NOTE — ED Triage Notes (Signed)
 Pt played basketball this morning, had some food and water, then said suddenly the left side of his chest spasmed really badly. This has happened before, but feels worse this time because it didn't resolve as usual. EMS activated and paramedics saw him on scene, EKG done. Pt declined ride to the hospital. No n/v/dizziness. No radiation of pain to arm or jaw.

## 2024-10-15 NOTE — ED Provider Notes (Signed)
 [place] [facility]MENT AT [facility] Provider Note   CSN: [id number] Arrival date & time: 10/15/24  1124     Patient presents with: Chest Pain   Steven Gross is a 33 y.o. male.   33 year old male with no reported past medical history presenting to the emergency department today for with some cramping and pain in his chest wall.  The patient states this been going now for the past few days.  He states that this started after playing basketball this morning.  The patient states that he was having some difficulty catching his breath initially.  States this happened for the past but not to this degree.  He reports that usually when he slow down his breathing that this would improve.  He came to the ER today for further evaluation due to these ongoing symptoms.   Chest Pain      Prior to Admission medications   Medication Sig Start Date End Date Taking? Authorizing Provider  methocarbamol (ROBAXIN) 500 MG tablet Take 1 tablet (500 mg total) by mouth every 8 (eight) hours as needed for muscle spasms. 10/15/24  Yes Ula Prentice SAUNDERS, MD    Allergies: Patient has no known allergies.    Review of Systems  Cardiovascular:  Positive for chest pain.  All other systems reviewed and are negative.   Updated Vital Signs BP (!) 146/82 (BP Location: Right Arm)   Pulse 90   Temp 98.8 F (37.1 C) (Temporal)   Resp 19   Ht 5' 10 (1.778 m)   Wt 70.3 kg   SpO2 100%   BMI 22.24 kg/m   Physical Exam Vitals and nursing note reviewed.   Gen: NAD Eyes: PERRL, EOMI HEENT: no oropharyngeal swelling Neck: trachea midline Resp: clear to auscultation bilaterally, tender over the left anterior chest wall and there does appear to be some muscle fasciculations noted over the intercostal muscles over the lower left chest wall Card: RRR, no murmurs, rubs, or gallops Abd: nontender, nondistended Extremities: no calf tenderness, no edema Vascular: 2+ radial pulses bilaterally, 2+  DP pulses bilaterally Skin: no rashes Psyc: acting appropriately   (all labs ordered are listed, but only abnormal results are displayed) Labs Reviewed  BASIC METABOLIC PANEL WITH GFR - Abnormal; Notable for the following components:      Result Value   Potassium 3.4 (*)    CO2 21 (*)    Glucose, Bld 126 (*)    Anion gap 16 (*)    All other components within normal limits  CBC  TROPONIN T, HIGH SENSITIVITY  TROPONIN T, HIGH SENSITIVITY    EKG: EKG Interpretation Date/Time:  Saturday October 15 2024 11:29:46 EDT Ventricular Rate:  94 PR Interval:  183 QRS Duration:  88 QT Interval:  341 QTC Calculation: 427 R Axis:   83  Text Interpretation: Sinus rhythm Right atrial enlargement Nonspecific ST abnormality Confirmed by Ula Prentice [phone]) on 10/15/2024 11:31:59 AM  Radiology: [facility] Chest 2 View Result Date: 10/15/2024 CLINICAL DATA:  Left-sided chest pain. EXAM: DG CHEST 2V COMPARISON:  None Available. FINDINGS: The cardiac silhouette, mediastinal and hilar contours are normal. The lungs are clear. No pleural effusion or pneumothorax. The bony thorax is intact. IMPRESSION: No acute cardiopulmonary findings. Electronically Signed   By: MYRTIS Stammer M.D.   On: 10/15/2024 12:50     Procedures   Medications Ordered in the ED  diazepam (VALIUM) tablet 5 mg (5 mg Oral Given 10/15/24 1220)  potassium chloride SA (KLOR-CON  M) CR tablet 40 mEq (40 mEq Oral Given 10/15/24 1313)                                    Medical Decision Making 33 year old male with no reported past medical history presenting to the emergency department today with chest wall pain with what appears to be some muscle spasms here.  Will further evaluate the patient here with labs to evaluate for electrolyte abnormality as well as a chest x-ray to evaluate for pneumothorax.  I give the patient Valium here.  He is deferring on an IV at this time so we will hold off.  I will reevaluate for ultimate disposition.   Based on description of his symptoms and with him being [facility] negative suspicion for pulmonary embolism is low at this time.  The patient's workup is reassuring.  Labs consistent with mild dehydration.  Potassium is low at 3.4 and the patient is given oral potassium here.  Symptoms improved with the medications here.  Cardiac evaluation and lung evaluation are unremarkable.  I think the patient is stable for discharge.  Will discharge with return precautions.  Amount and/or Complexity of Data Reviewed Labs: ordered. Radiology: ordered.  Risk Prescription drug management.        Final diagnoses:  Dehydration after exertion  Muscle spasm    ED Discharge Orders          Ordered    methocarbamol (ROBAXIN) 500 MG tablet  Every 8 hours PRN        10/15/24 1338               Ula Prentice SAUNDERS, MD 10/15/24 1339

## 2024-10-20 ENCOUNTER — Emergency Department (HOSPITAL_COMMUNITY)
Admission: EM | Admit: 2024-10-20 | Discharge: 2024-10-20 | Disposition: A | Discharge status: Home / Self Care | Attending: Student in an Organized Health Care Education/Training Program | Admitting: Student in an Organized Health Care Education/Training Program

## 2024-10-20 ENCOUNTER — Other Ambulatory Visit: Payer: Self-pay

## 2024-10-20 ENCOUNTER — Emergency Department (HOSPITAL_COMMUNITY)

## 2024-10-20 ENCOUNTER — Encounter (HOSPITAL_COMMUNITY): Payer: Self-pay

## 2024-10-20 DIAGNOSIS — R1013 Epigastric pain: Secondary | ICD-10-CM | POA: Diagnosis not present

## 2024-10-20 DIAGNOSIS — E876 Hypokalemia: Secondary | ICD-10-CM | POA: Diagnosis not present

## 2024-10-20 DIAGNOSIS — R0789 Other chest pain: Secondary | ICD-10-CM | POA: Diagnosis present

## 2024-10-20 LAB — CBC WITH DIFFERENTIAL/PLATELET
Abs Immature Granulocytes: 0.02 K/uL (ref 0.00–0.07)
Basophils Absolute: 0 K/uL (ref 0.0–0.1)
Basophils Relative: 1 %
Eosinophils Absolute: 0 K/uL (ref 0.0–0.5)
Eosinophils Relative: 1 %
HCT: 47.8 % (ref 39.0–52.0)
Hemoglobin: 16.9 g/dL (ref 13.0–17.0)
Immature Granulocytes: 0 %
Lymphocytes Relative: 19 %
Lymphs Abs: 0.9 K/uL (ref 0.7–4.0)
MCH: 31.1 pg (ref 26.0–34.0)
MCHC: 35.4 g/dL (ref 30.0–36.0)
MCV: 88 fL (ref 80.0–100.0)
Monocytes Absolute: 0.3 K/uL (ref 0.1–1.0)
Monocytes Relative: 6 %
Neutro Abs: 3.5 K/uL (ref 1.7–7.7)
Neutrophils Relative %: 73 %
Platelets: 304 K/uL (ref 150–400)
RBC: 5.43 MIL/uL (ref 4.22–5.81)
RDW: 11.3 % — ABNORMAL LOW (ref 11.5–15.5)
WBC: 4.7 K/uL (ref 4.0–10.5)
nRBC: 0 % (ref 0.0–0.2)

## 2024-10-20 LAB — COMPREHENSIVE METABOLIC PANEL WITH GFR
ALT: 23 U/L (ref 0–44)
AST: 25 U/L (ref 15–41)
Albumin: 4.7 g/dL (ref 3.5–5.0)
Alkaline Phosphatase: 66 U/L (ref 38–126)
Anion gap: 12 (ref 5–15)
BUN: 9 mg/dL (ref 6–20)
CO2: 23 mmol/L (ref 22–32)
Calcium: 9.5 mg/dL (ref 8.9–10.3)
Chloride: 101 mmol/L (ref 98–111)
Creatinine, Ser: 1.1 mg/dL (ref 0.61–1.24)
GFR, Estimated: >60 mL/min (ref >60)
Glucose, Bld: 133 mg/dL — ABNORMAL HIGH (ref 70–99)
Potassium: 3.3 mmol/L — ABNORMAL LOW (ref 3.5–5.1)
Sodium: 136 mmol/L (ref 135–145)
Total Bilirubin: 1.2 mg/dL (ref 0.0–1.2)
Total Protein: 8.4 g/dL — ABNORMAL HIGH (ref 6.5–8.1)

## 2024-10-20 LAB — URINALYSIS, ROUTINE W REFLEX MICROSCOPIC
Bilirubin Urine: NEGATIVE
Glucose, UA: NEGATIVE mg/dL
Hgb urine dipstick: NEGATIVE
Ketones, ur: NEGATIVE mg/dL
Leukocytes,Ua: NEGATIVE
Nitrite: NEGATIVE
Protein, ur: NEGATIVE mg/dL
Specific Gravity, Urine: 1.005 (ref 1.005–1.030)
pH: 6 (ref 5.0–8.0)

## 2024-10-20 LAB — I-STAT CHEM 8, ED
BUN: 10 mg/dL (ref 6–20)
Calcium, Ion: 1.08 mmol/L — ABNORMAL LOW (ref 1.15–1.40)
Chloride: 100 mmol/L (ref 98–111)
Creatinine, Ser: 1.1 mg/dL (ref 0.61–1.24)
Glucose, Bld: 133 mg/dL — ABNORMAL HIGH (ref 70–99)
HCT: 52 % (ref 39.0–52.0)
Hemoglobin: 17.7 g/dL — ABNORMAL HIGH (ref 13.0–17.0)
Potassium: 3.2 mmol/L — ABNORMAL LOW (ref 3.5–5.1)
Sodium: 139 mmol/L (ref 135–145)
TCO2: 24 mmol/L (ref 22–32)

## 2024-10-20 LAB — LIPASE, BLOOD: Lipase: 23 U/L (ref 11–51)

## 2024-10-20 LAB — TROPONIN I (HIGH SENSITIVITY): Troponin I (High Sensitivity): <2 ng/L (ref <18)

## 2024-10-20 MED ORDER — MORPHINE SULFATE (PF) 4 MG/ML IV SOLN
4.0000 mg | Freq: Once | INTRAVENOUS | Status: AC
  Administered 2024-10-20: 4 mg via INTRAVENOUS
  Filled 2024-10-20: qty 1

## 2024-10-20 MED ORDER — IOHEXOL 350 MG/ML SOLN
75.0000 mL | Freq: Once | INTRAVENOUS | Status: AC | PRN
Start: 2024-10-20 — End: 2024-10-20
  Administered 2024-10-20: 75 mL via INTRAVENOUS

## 2024-10-20 MED ORDER — LACTATED RINGERS IV BOLUS
1000.0000 mL | Freq: Once | INTRAVENOUS | Status: AC
  Administered 2024-10-20: 1000 mL via INTRAVENOUS

## 2024-10-20 MED ORDER — IOHEXOL 350 MG/ML SOLN
75.0000 mL | Freq: Once | INTRAVENOUS | Status: AC | PRN
Start: 2024-10-20 — End: 2024-10-20
  Administered 2024-10-20: 65 mL via INTRAVENOUS

## 2024-10-20 MED ORDER — KETOROLAC TROMETHAMINE 15 MG/ML IJ SOLN
15.0000 mg | Freq: Once | INTRAMUSCULAR | Status: AC
  Administered 2024-10-20: 15 mg via INTRAVENOUS
  Filled 2024-10-20: qty 1

## 2024-10-20 NOTE — Discharge Instructions (Addendum)
 You were evaluated in the emergency department today for abd/chest discomfort.  Based on your evaluation, there is no evidence of a serious or life-threatening condition at this time.  However, we recommend that you continue closely monitoring your symptoms and arrange close follow-up with your primary care provider for reevaluation.  Your symptoms may improve on their own, but we recommend follow-up or return to the emergency department if they continue/worsen.  Follow-up: - Follow-up with your primary care provider or a specialist if your symptoms persist, worsen, or you have additional concerns.   - We typically recommend follow-up with your PCP within the next 1-3 days, unless directed otherwise by your medical provider. - If a follow-up appointment has already been scheduled, we recommend you continue to keep that appointment to discuss your recent ED visit  Return to the emergency department if you experience: - Worsening or new symptoms - New fever or fever that persists - Difficulty breathing, chest pain, severe pain, or weakness. - Signs of infection at any wound site (increased redness, warmth, discharge) - You have any other concerns or unexpected changes  Medications: - New medications prescribed by the ED: N/A - Pain relief: Use over-the-counter pain relievers like ibuprofen or Tylenol as directed on the package.  Talk to your medical provider if you have certain medical conditions like kidney disease or liver disease to make sure these medications are safe for you to use. - Continue to take all your medications as directed.  Contact your primary care physician (or seek medical care from a medical provider) if you have any side effects or questions  Other instructions: - Drink plenty of fluids to stay hydrated (we recommend water or fluids that contain electrolytes including Gatorade/Powerade/Pedialyte) - Continue to rest and care for yourself at home - Avoid strenuous activity  if feeling unwell - If applicable: consider applying ice packs or a heating pad to the affected area of injury for 20 minutes at a time, 4-5 times a day, for the next 24-48 hours.   Once again, we highly encourage you to contact your primary care provider or return to the emergency department if you have any further concerns or questions.

## 2024-10-20 NOTE — ED Provider Notes (Signed)
 [place] [facility] AT [facility] Provider Note   CSN: [id number] Arrival date & time: 10/20/24  1309     Patient presents with: Chest Pain and Spasms   Steven Gross is a 33 y.o. male.   33 year old male presenting to the emergency department for evaluation of his intermittent abdominal/chest discomfort for the past week.  Patient reports that he has had this in the past but never to this severity.  He feels as though it is a cramping sensation that is severe and he is unable to sit down.  He denies any fevers, vomiting, diarrhea, or urinary symptoms.  He was evaluated at an outside ED several days ago and had a negative cardiac workup at that time.   Chest Pain      Prior to Admission medications   Medication Sig Start Date End Date Taking? Authorizing Provider  methocarbamol (ROBAXIN) 500 MG tablet Take 1 tablet (500 mg total) by mouth every 8 (eight) hours as needed for muscle spasms. 10/15/24   Ula Prentice SAUNDERS, MD    Allergies: Patient has no known allergies.    Review of Systems  Cardiovascular:  Positive for chest pain.  All other systems reviewed and are negative.   Updated Vital Signs BP 128/72   Pulse 87   Temp 98 F (36.7 C) (Oral)   Resp 13   SpO2 100%   Physical Exam Vitals and nursing note reviewed.  Constitutional:      General: He is in acute distress.  Eyes:     Extraocular Movements: Extraocular movements intact.  Cardiovascular:     Rate and Rhythm: Tachycardia present.  Pulmonary:     Effort: Pulmonary effort is normal. No respiratory distress.  Abdominal:     Tenderness: There is abdominal tenderness.     Comments: Mild tenderness in the epigastric region to palpation  Musculoskeletal:     Cervical back: Neck supple.  Skin:    General: Skin is warm.     Capillary Refill: Capillary refill takes less than 2 seconds.  Neurological:     Mental Status: He is alert and oriented to person, place, and time.  Psychiatric:         Mood and Affect: Mood is anxious.     (all labs ordered are listed, but only abnormal results are displayed) Labs Reviewed  COMPREHENSIVE METABOLIC PANEL WITH GFR - Abnormal; Notable for the following components:      Result Value   Potassium 3.3 (*)    Glucose, Bld 133 (*)    Total Protein 8.4 (*)    All other components within normal limits  CBC WITH DIFFERENTIAL/PLATELET - Abnormal; Notable for the following components:   RDW 11.3 (*)    All other components within normal limits  URINALYSIS, ROUTINE W REFLEX MICROSCOPIC - Abnormal; Notable for the following components:   Color, Urine STRAW (*)    All other components within normal limits  I-STAT CHEM 8, ED - Abnormal; Notable for the following components:   Potassium 3.2 (*)    Glucose, Bld 133 (*)    Calcium, Ion 1.08 (*)    Hemoglobin 17.7 (*)    All other components within normal limits  LIPASE, BLOOD  TROPONIN I (HIGH SENSITIVITY)  TROPONIN I (HIGH SENSITIVITY)  TROPONIN I (HIGH SENSITIVITY)    EKG: EKG Interpretation Date/Time:  Thursday October 20 2024 18:19:24 EDT Ventricular Rate:  104 PR Interval:  178 QRS Duration:  87 QT Interval:  336 QTC  Calculation: 442 R Axis:   65  Text Interpretation: Sinus tachycardia Probable left atrial enlargement Confirmed by Corinthia No [phone]) on 10/20/2024 8:15:09 PM  Radiology: CT Angio Chest PE W and/or Wo Contrast Result Date: 10/20/2024 EXAM: CTA of the Chest with contrast for PE 10/20/2024 07:41:25 PM TECHNIQUE: CTA of the chest was performed without and with the administration of 65 mL of intravenous iohexol (OMNIPAQUE) 350 MG/ML injection. Multiplanar reformatted images are provided for review. MIP images are provided for review. Automated exposure control, iterative reconstruction, and/or weight based adjustment of the mA/kV was utilized to reduce the radiation dose to as low as reasonably achievable. COMPARISON: None available. CLINICAL HISTORY: Pulmonary  embolism (PE) suspected, high prob. FINDINGS: PULMONARY ARTERIES: Pulmonary arteries are adequately opacified for evaluation. No pulmonary embolism. Main pulmonary artery is normal in caliber. MEDIASTINUM: There is residual thymus in the anterior mediastinum. The heart and pericardium demonstrate no acute abnormality. There is no acute abnormality of the thoracic aorta. LYMPH NODES: No mediastinal, hilar or axillary lymphadenopathy. LUNGS AND PLEURA: There is a 2 mm nodule in the left lower lobe image 6/114. No focal consolidation or pulmonary edema. No pleural effusion or pneumothorax. UPPER ABDOMEN: Limited images of the upper abdomen are unremarkable. SOFT TISSUES AND BONES: No acute bone or soft tissue abnormality. IMPRESSION: 1. No pulmonary embolism. 2. 2 mm solid pulmonary nodule in the left lower lobe. Per Fleischner Society Guidelines for incidental nodules 5 mm: no routine follow-up imaging is recommended (patient risk not specified and no high-risk nodule features described). Electronically signed by: No Pique MD 10/20/2024 07:48 PM EDT RP Workstation: [id number]   CT ABDOMEN PELVIS W CONTRAST Result Date: 10/20/2024 CLINICAL DATA:  Abdominal pain. EXAM: CT ABDOMEN AND PELVIS WITH CONTRAST TECHNIQUE: Multidetector CT imaging of the abdomen and pelvis was performed using the standard protocol following bolus administration of intravenous contrast. RADIATION DOSE REDUCTION: This exam was performed according to the departmental dose-optimization program which includes automated exposure control, adjustment of the mA and/or kV according to patient size and/or use of iterative reconstruction technique. CONTRAST:  75mL OMNIPAQUE IOHEXOL 350 MG/ML SOLN COMPARISON:  CT abdomen pelvis dated 04/21/2017. FINDINGS: Evaluation is limited due to streak artifact caused by patient's arms. Lower chest: No acute abnormality. No intra-abdominal free air or free fluid. Hepatobiliary: No focal liver abnormality is  seen. No gallstones, gallbladder wall thickening, or biliary dilatation. Pancreas: Unremarkable. No pancreatic ductal dilatation or surrounding inflammatory changes. Spleen: Normal in size without focal abnormality. Adrenals/Urinary Tract: The adrenal glands, kidneys, and urinary bladder appear unremarkable. Stomach/Bowel: Mild thickened appearance of the distal colon likely related to underdistention. Colitis is less likely. There is no bowel obstruction. The appendix is unremarkable. Vascular/Lymphatic: The abdominal aorta and IVC are unremarkable. No portal venous gas. There is no adenopathy. Reproductive: Os and seminal vesicles are grossly unremarkable Other: None Musculoskeletal: No acute or significant osseous findings. IMPRESSION: No acute intra-abdominal or pelvic pathology. Electronically Signed   By: Vanetta Chou M.D.   On: 10/20/2024 15:09     Procedures   Medications Ordered in the ED  iohexol (OMNIPAQUE) 350 MG/ML injection 75 mL (75 mLs Intravenous Contrast Given 10/20/24 1458)  lactated ringers bolus 1,000 mL (1,000 mLs Intravenous New Bag/Given 10/20/24 1936)  morphine (PF) 4 MG/ML injection 4 mg (4 mg Intravenous Given 10/20/24 1936)  iohexol (OMNIPAQUE) 350 MG/ML injection 75 mL (65 mLs Intravenous Contrast Given 10/20/24 1941)  Medical Decision Making 33 year old male presenting to the emergency department for reevaluation of his intermittent epigastric/chest discomfort over the past week.  Patient does appear in discomfort and has to stand in the examination room due to the pain.  His blood pressure, heart rate and respiratory rate are all elevated.  The workup performed in triage is overall unremarkable other than mild hypokalemia.  He also received a CT of the abdomen/pelvis which showed no acute abnormalities.  This ruled out any evidence of acute pancreatitis, kidney stone, small bowel obstruction, and others.  We have added on a  troponin and a CTA of the chest to rule out evidence of pulmonary embolism since he continues to have discomfort with elevated heart rate.  Patient also received morphine and IV fluids during his evaluation.  On reevaluation, patient's vitals had improved and he appeared more comfortable.  He reports muscle twitching within his abdominal wall as his main symptom at this point.  His CTA chest was negative for pulmonary embolism or other abnormalities.  We discussed his symptoms and lab work.  Symptoms could be secondary to hypokalemia given the muscle fasciculations and that could be a component of anxiety.  However, he is encouraged to continue to follow closely with his primary care physician and return if he has any worsening symptoms.  Amount and/or Complexity of Data Reviewed Radiology: ordered.  Risk Prescription drug management.     Final diagnoses:  Chest wall pain  Hypokalemia    ED Discharge Orders     None          Corinthia No, DO 10/20/24 2054

## 2024-10-20 NOTE — ED Triage Notes (Signed)
 In addition to initial triage note, pt denies sob. He is AxOx4.

## 2024-10-20 NOTE — ED Provider Triage Note (Signed)
 Emergency Medicine Provider Triage Evaluation Note  Steven Gross , a 33 y.o. male  was evaluated in triage.  Pt complains of epigastric, right and left upper quadrant abdominal pain.   Review of Systems  Positive: Upper quadrant abdominal pain and epigastric abdominal pain. Negative: Chest pain, shortness of breath, syncope,  Physical Exam  BP (!) 149/89 (BP Location: Left Arm)   Pulse (!) 106   Temp 98 F (36.7 C)   Resp (!) 24   SpO2 100%  Gen:   Awake, no distress   Resp:  Normal effort  MSK:   Moves extremities without difficulty  Other:  Pain to palpation in the right and left upper quadrant of the abdomen.  No Murphy's or McBurney's.  Medical Decision Making  Medically screening exam initiated at 1:51 PM.  Appropriate orders placed.  Steven Gross was informed that the remainder of the evaluation will be completed by another provider, this initial triage assessment does not replace that evaluation, and the importance of remaining in the ED until their evaluation is complete.  Patient has been seen in the ED prior for same with negative cardiac workup.  Patient reports left and right abdominal quadrant pain.  Patient reports that it is very intermittent in nature and has been going on for several weeks.  Patient reports the pain feels like a cramping and he is unable to sit down.  Patient denies any vomiting, nausea, diarrhea, constipation.   Myriam Fonda RAMAN, [place] 10/20/24 [phone]

## 2024-10-20 NOTE — ED Triage Notes (Signed)
 QUICK TRIAGE: Pt to ER with c/o muscle spasms to middle of left chest that started on Saturday. Was seen at [facility] on that date.

## 2024-10-20 NOTE — Telephone Encounter (Signed)
 Referral placed for both pulmonology and PT/swb

## 2024-11-07 NOTE — Progress Notes (Signed)
 [facility] - Clinic Note  11/21/2024     CHIEF COMPLAINT Patient presents for Retina Follow Up   HISTORY OF PRESENT ILLNESS: Steven Gross is a 33 y.o. male who presents to the clinic today for:   HPI     Retina Follow Up   In right eye.  This started 6 months ago.  Duration of 6 months.  Since onset it is stable.  I, the attending physician,  performed the HPI with the patient and updated documentation appropriately.        Comments   6 month retina follow up lattice deg OD pt is reporting no vision changes noticed he denies any flashes or floaters      Last edited by Valdemar Rogue, MD on 11/21/2024  7:35 PM.      Pt states no vision changes, no floaters or FOL.   Referring physician: No referring provider defined for this encounter.  HISTORICAL INFORMATION:   Selected notes from the MEDICAL RECORD NUMBER Referred by Dr. Gust for retina eval -- h/o ROP s/p laser  LEE:  Ocular Hx- PMH-    CURRENT MEDICATIONS: No current outpatient medications on file. (Ophthalmic Drugs)   No current facility-administered medications for this visit. (Ophthalmic Drugs)   Current Outpatient Medications (Other)  Medication Sig   methocarbamol  (ROBAXIN ) 500 MG tablet Take 1 tablet (500 mg total) by mouth every 8 (eight) hours as needed for muscle spasms.   No current facility-administered medications for this visit. (Other)   REVIEW OF SYSTEMS: ROS   Positive for: Eyes Negative for: Constitutional, Gastrointestinal, Neurological, Skin, Genitourinary, Musculoskeletal, HENT, Endocrine, Cardiovascular, Respiratory, Psychiatric, Allergic/Imm, Heme/Lymph Last edited by Resa Delon ORN, COT on 11/21/2024  2:03 PM.      ALLERGIES No Known Allergies  PAST MEDICAL HISTORY History reviewed. No pertinent past medical history. Past Surgical History:  Procedure Laterality Date   RETINAL DETACHMENT SURGERY Left    FAMILY HISTORY Family History  Problem  Relation Age of Onset   Healthy Mother    Healthy Father    SOCIAL HISTORY Social History   Tobacco Use   Smoking status: Never    Passive exposure: Never   Smokeless tobacco: Never  Substance Use Topics   Alcohol use: No   Drug use: Never       OPHTHALMIC EXAM:  Base Eye Exam     Visual Acuity (Snellen - Linear)       Right Left   Dist cc 20/20 20/70   Dist ph cc  20/50 -1         Tonometry (Tonopen, 2:09 PM)       Right Left   Pressure 14 16         Pupils       Pupils Dark Light Shape React APD   Right PERRL 4 3 Round Brisk None   Left PERRL 4 3 Round Brisk None         Visual Fields       Left Right    Full Full         Extraocular Movement       Right Left    Full, Ortho Full, Ortho         Neuro/Psych     Oriented x3: Yes   Mood/Affect: Normal         Dilation     Both eyes: 2.5% Phenylephrine @ 2:09 PM  Slit Lamp and Fundus Exam     External Exam       Right Left   External Normal Normal         Slit Lamp Exam       Right Left   Lids/Lashes Normal Normal   Conjunctiva/Sclera White and quiet White and quiet   Cornea Clear Corneal haze/scar centrally, early band K nasally   Anterior Chamber Deep and clear Deep and clear   Iris Round and dilated, No NVI, +PPM Round and dilated, No NVI, +PPM   Lens Clear Clear   Anterior Vitreous mild syneresis, mild Vitreous condensations Vitreous syneresis         Fundus Exam       Right Left   Disc Pink and sharp, mild PPP Pink and sharp, vascular loops   C/D Ratio 0.5 0.5   Macula Flat, Good foveal reflex, No heme or edema Flat, Good foveal reflex, No heme or edema   Vessels attenuated, Tortuous, no NV Attenuated, tortuous, mild peripheral fibriosis, no NV   Periphery Attached, mild peripheral fibrosis 360, no heme, pigmented CR scarring temporally, focal pigmented lattice and atrophic hole at 0500 and 1100, pigmented lattice without holes from 0900-1000 --  good laser changes surrounding all lesions and now with good fill in from 1000-1100; WWoP and avascular ridge IT periphery, fine fibrotic ridge nasal periphery, no new RT/RD/lattice Attached, dense pigmented CR scarring 360 greatest temporally, no heme, mild peripheral fibrosis posterior to laser, No RT/RD           Refraction     Wearing Rx       Sphere Cylinder Axis   Right -6.00 +0.25 096   Left -8.25 +1.50 036            IMAGING AND PROCEDURES  Imaging and Procedures for 11/21/2024  OCT, Retina - OU - Both Eyes       Right Eye Quality was good. Central Foveal Thickness: 322. Progression has been stable. Findings include normal foveal contour, no IRF, no SRF, macular pucker, vitreomacular adhesion (Mild pucker).   Left Eye Quality was good. Central Foveal Thickness: 307. Progression has been stable. Findings include normal foveal contour, no IRF, no SRF, outer retinal atrophy, vitreomacular adhesion (Large patch ORA temporal periphery caught on widefield).   Notes *Images captured and stored on drive  Diagnosis / Impression:  NFP; no IRF/SRF OU OD: Mild pucker OS: Large patch of ORA temporal periphery caught on widefield  Clinical management:  See below  Abbreviations: NFP - Normal foveal profile. CME - cystoid macular edema. PED - pigment epithelial detachment. IRF - intraretinal fluid. SRF - subretinal fluid. EZ - ellipsoid zone. ERM - epiretinal membrane. ORA - outer retinal atrophy. ORT - outer retinal tubulation. SRHM - subretinal hyper-reflective material. 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 Retinal hole of right eye  H33.321 OCT, Retina - OU - Both Eyes    3. 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  Lattice degeneration w/ atrophic holes, right eye - focal pigmented lattice w/ holes at 0500 and 1100  oclock - pigmented lattice w/o holes from 0900-1000 oclock - s/p laser retinopexy OD (02.18.25), fill-in laser retinopexy from 10-11 oclock (05.13.25) -- good laser changes in place - no new RT/RD or lattice - f/u 1 yr, DFE, OCT  3,4. History of retinopathy of prematurity s/p laser  - unsure of gestational age or birth wt. - h/o laser as a baby, pt unsure of where treatment was performed - OCT stable OU -- OD: Mild pucker, OS: Large patch ORA caught on widefield - FA (05.06.24) shows OD: Vascular non-perfusion temporal periphery, no leakage or NV; OS: 360 staining and window defect from peripheral ROP laser, focal vascualar non-perfusion far nasal periphery, no NV - BCVA stable OU -- OD 20/20, OS 20/50 (pt with significant corneal scar OS) - exam shows extensive CR scarring OU; extensive laser OS, minimal OD; no significant hemorrhage or active NV OU - follow up 1 yr, DFE, OCT  5. Corneal scarring cristobal OS  - under the expert management of Dr. Gust  Ophthalmic Meds Ordered this visit:  No orders of the defined types were placed in this encounter.    Return in about 1 year (around 11/21/2025) for lattice degen OU, DFE, OCT.  There are no Patient Instructions on file for this visit.  Explained the diagnoses, plan, and follow up with the patient and they expressed understanding.  Patient expressed understanding of the importance of proper follow up care.   This document serves as a record of services personally performed by Redell JUDITHANN Hans, MD, PhD. It was created on their behalf by Avelina Pereyra, COA an ophthalmic technician. The creation of this record is the provider's dictation and/or activities during the visit.   Electronically signed by: Avelina GORMAN Pereyra, COT  11/21/24  7:37 PM    This document serves as a record of services personally performed by Redell JUDITHANN Hans, MD, PhD. It was created on their behalf by Almetta Pesa, an ophthalmic technician. The creation of this record is the provider's dictation and/or activities during the visit.    Electronically signed by: Almetta Pesa, OA, 11/21/24  7:37 PM   Redell JUDITHANN Hans, M.D., Ph.D. Diseases & Surgery of the Retina and Vitreous [facility]  I have reviewed the above documentation for accuracy and completeness, and I agree with the above. Redell JUDITHANN Hans, M.D., Ph.D. 11/21/24 7:48 PM    Abbreviations: M myopia (nearsighted); A astigmatism; H hyperopia (farsighted); P presbyopia; Mrx spectacle prescription;  CTL contact lenses; OD right eye; OS left eye; OU both eyes  XT exotropia; ET esotropia; PEK punctate epithelial keratitis; PEE punctate epithelial erosions; DES dry eye syndrome; MGD meibomian gland dysfunction; ATs artificial tears; PFAT's preservative free artificial tears; NSC nuclear sclerotic cataract; PSC posterior subcapsular cataract; ERM epi-retinal membrane; PVD posterior vitreous detachment; RD retinal detachment; DM diabetes mellitus; DR diabetic retinopathy; NPDR non-proliferative diabetic retinopathy; PDR proliferative diabetic retinopathy; CSME clinically significant macular edema; DME diabetic macular edema; dbh dot blot hemorrhages; CWS cotton wool spot; POAG primary open angle glaucoma; C/D cup-to-disc ratio; HVF humphrey visual field; GVF goldmann visual field; OCT optical coherence tomography; IOP intraocular pressure; BRVO Branch retinal vein occlusion; CRVO central retinal vein occlusion; CRAO central retinal artery occlusion; BRAO branch retinal artery occlusion; RT retinal tear; SB scleral buckle; PPV pars plana vitrectomy; VH Vitreous hemorrhage; PRP panretinal laser photocoagulation; IVK intravitreal kenalog; VMT vitreomacular traction; MH Macular hole;  NVD neovascularization of the disc; NVE neovascularization elsewhere; AREDS age related eye  disease study; ARMD age related macular degeneration; POAG primary open angle glaucoma; EBMD epithelial/anterior basement membrane dystrophy; ACIOL anterior chamber intraocular lens; IOL intraocular lens; PCIOL posterior chamber intraocular lens; Phaco/IOL phacoemulsification with intraocular lens placement; PRK photorefractive keratectomy; LASIK laser assisted in situ keratomileusis; HTN hypertension; DM diabetes mellitus; COPD chronic obstructive pulmonary disease

## 2024-11-21 ENCOUNTER — Ambulatory Visit (INDEPENDENT_AMBULATORY_CARE_PROVIDER_SITE_OTHER): Admitting: Ophthalmology

## 2024-11-21 ENCOUNTER — Encounter (INDEPENDENT_AMBULATORY_CARE_PROVIDER_SITE_OTHER): Payer: Self-pay | Admitting: Ophthalmology

## 2024-11-21 DIAGNOSIS — H31003 Unspecified chorioretinal scars, bilateral: Secondary | ICD-10-CM | POA: Diagnosis not present

## 2024-11-21 DIAGNOSIS — H35411 Lattice degeneration of retina, right eye: Secondary | ICD-10-CM

## 2024-11-21 DIAGNOSIS — H33321 Round hole, right eye: Secondary | ICD-10-CM | POA: Diagnosis not present

## 2024-11-21 DIAGNOSIS — H179 Unspecified corneal scar and opacity: Secondary | ICD-10-CM

## 2024-11-21 DIAGNOSIS — Z8669 Personal history of other diseases of the nervous system and sense organs: Secondary | ICD-10-CM

## 2025-11-21 ENCOUNTER — Encounter (INDEPENDENT_AMBULATORY_CARE_PROVIDER_SITE_OTHER): Admitting: Ophthalmology
